# Patient Record
Sex: Female | Born: 1995
Health system: Southern US, Community
[De-identification: ages and names within clinical notes are randomized; demographics above are authoritative.]

## PROBLEM LIST (undated history)

## (undated) DIAGNOSIS — F3176 Bipolar disorder, in full remission, most recent episode depressed: Secondary | ICD-10-CM

## (undated) DIAGNOSIS — F419 Anxiety disorder, unspecified: Secondary | ICD-10-CM

## (undated) DIAGNOSIS — F329 Major depressive disorder, single episode, unspecified: Secondary | ICD-10-CM

## (undated) DIAGNOSIS — F32A Depression, unspecified: Secondary | ICD-10-CM

## (undated) HISTORY — DX: Depression, unspecified: F32.A

## (undated) HISTORY — PX: WISDOM TOOTH EXTRACTION: SHX21

## (undated) HISTORY — DX: Anxiety disorder, unspecified: F41.9

## (undated) HISTORY — PX: INDUCED ABORTION: SHX677

## (undated) HISTORY — DX: Major depressive disorder, single episode, unspecified: F32.9

---
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
## 2016-06-16 ENCOUNTER — Ambulatory Visit (INDEPENDENT_AMBULATORY_CARE_PROVIDER_SITE_OTHER): Payer: BLUE CROSS/BLUE SHIELD | Admitting: Family Medicine

## 2016-06-16 ENCOUNTER — Encounter: Payer: Self-pay | Admitting: Family Medicine

## 2016-06-16 VITALS — BP 118/64 | HR 100 | Temp 98.4°F | Resp 16 | Ht 67.0 in | Wt 191.0 lb

## 2016-06-16 DIAGNOSIS — N3090 Cystitis, unspecified without hematuria: Secondary | ICD-10-CM

## 2016-06-16 LAB — POCT URINALYSIS DIPSTICK
Bilirubin, UA: NEGATIVE
Blood, UA: NEGATIVE
Glucose, UA: NEGATIVE
Ketones, UA: NEGATIVE
Leukocytes, UA: NEGATIVE
Nitrite, UA: NEGATIVE
Protein, UA: NEGATIVE
Spec Grav, UA: 1.02 (ref 1.030–1.035)
Urobilinogen, UA: 0.2 (ref ?–2.0)
pH, UA: 6 (ref 5.0–8.0)

## 2016-06-16 MED ORDER — HYDROXYZINE HCL 10 MG PO TABS: 10.0000 mg | ORAL_TABLET | Freq: Three times a day (TID) | ORAL | 0 refills | Status: DC | PRN

## 2016-06-16 NOTE — Patient Instructions (Signed)
Try Uristat or similar for burning. We will call you with the referral time.

## 2016-06-16 NOTE — Progress Notes (Signed)
Subjective:     Patient ID: Olivia Thomas, female   DOB: May 19, 1995, 21 y.o.   MRN: [id number]  HPI  Chief Complaint  Patient presents with  . Urinary Tract Infection    Patient comes in today c/o burning on urination X 1 week. Patient reports that she has been drinking cranberry juice and taking tylenol for the pain. She denies any hematuria. She does have lower back pain with associated nausea.   Has history of initial urology evaluation for interstitial cystitis but did not complete the workup. Imipramine did not help her per her report. No vaginal discharge.    Review of Systems     Objective:   Physical Exam  Constitutional: She appears well-developed and well-nourished. No distress.  Genitourinary:  Genitourinary Comments: No cva tenderness       Assessment:    1. Cystitis; Probable I.C. - POCT urinalysis dipstick - Ambulatory referral to Urology - hydrOXYzine (ATARAX/VISTARIL) 10 MG tablet; Take 1 tablet (10 mg total) by mouth 3 (three) times daily as needed.  Dispense: 30 tablet; Refill: 0    Plan:    May also try otc urinary analgesic.

## 2016-08-22 ENCOUNTER — Ambulatory Visit
Admission: EM | Admit: 2016-08-22 | Discharge: 2016-08-22 | Disposition: A | Discharge status: Home / Self Care | Payer: Worker's Compensation | Attending: Family Medicine | Admitting: Family Medicine

## 2016-08-22 DIAGNOSIS — S61210A Laceration without foreign body of right index finger without damage to nail, initial encounter: Secondary | ICD-10-CM

## 2016-08-22 NOTE — ED Provider Notes (Signed)
CSN: [id number]     Arrival date & time 08/22/16  1027 History   First MD Initiated Contact with Patient 08/22/16 1110     Chief Complaint  Patient presents with  . Laceration    right index finger   (Consider location/radiation/quality/duration/timing/severity/associated sxs/prior Treatment) HPI   This a 21 year old female who sustained an laceration to her right dominant index finger overlying the DIP joint with a broken glass last night a [employer]. Happened around 8 or 9:00. There are want to come here last night but we were closed at the time that they have chosen. The patient tells me she would not had stitches anyhow because she doesn't want anything injected into her body. Is here mostly because it was required by her employer. This toxoid is current within the last 3 years according to the patient. He states she would've also refused a tetanus injection.      History reviewed. No pertinent past medical history. Past Surgical History:  Procedure Laterality Date  . NO PAST SURGERIES     Family History  Problem Relation Age of Onset  . Autoimmune disease Mother   . Osteoarthritis Father    Social History  Substance Use Topics  . Smoking status: Never Smoker  . Smokeless tobacco: Never Used  . Alcohol use Not on file   OB History    No data available     Review of Systems  Constitutional: Positive for activity change. Negative for appetite change, chills, fatigue and fever.  Skin: Positive for wound.  All other systems reviewed and are negative.   Allergies  Kiwi extract  Home Medications   Prior to Admission medications   Medication Sig Start Date End Date Taking? Authorizing Provider  hydrOXYzine (ATARAX/VISTARIL) 10 MG tablet Take 1 tablet (10 mg total) by mouth 3 (three) times daily as needed. 06/16/16   Anola Gurneyhauvin, Robert, PA  ibuprofen (ADVIL,MOTRIN) 600 MG tablet Take 600 mg by mouth. 12/19/14   [provider]  imipramine (TOFRANIL) 10 MG  tablet Take 10 mg by mouth. 03/24/15   [provider]  topiramate (TOPAMAX) 25 MG tablet  01/15/15   [provider]   Meds Ordered and Administered this Visit  Medications - No data to display  BP 121/78 (BP Location: Left Arm)   Pulse 90   Temp 98 F (36.7 C) (Oral)   Resp 18   Ht 5\' 7"  (1.702 m)   Wt 180 lb (81.6 kg)   SpO2 100%   BMI 28.19 kg/m  No data found.   Physical Exam  Constitutional: She is oriented to person, place, and time. She appears well-developed and well-nourished. No distress.  HENT:  Head: Normocephalic.  Eyes: Pupils are equal, round, and reactive to light.  Neck: Normal range of motion.  Musculoskeletal: Normal range of motion. She exhibits tenderness.  Neurological: She is alert and oriented to person, place, and time.  Skin: Skin is warm and dry. She is not diaphoretic.  The patient has a slightly obliquely oriented 2 centimeter laceration with the proximal most portion extending into subcutaneous tissue overlying the PIP joint on the volar surface. She is good range of motion of the finger. FDS and FDP are intact. Sensation is intact to light touch throughout.  Psychiatric: She has a normal mood and affect. Her behavior is normal. Judgment and thought content normal.  Nursing note and vitals reviewed.   Urgent Care Course     Procedures (including critical care time)  Labs  Review Labs Reviewed - No data to display  Imaging Review No results found.   Visual Acuity Review  Right Eye Distance:   Left Eye Distance:   Bilateral Distance:    Right Eye Near:   Left Eye Near:    Bilateral Near:     Procedure: Patient refused any suggestion of stitches. I told patient that I would prefer a suturing over the joint but she refused. Therefore the wound was cleansed with chlorhexidine solution. Dried thoroughly. Dermabond was utilized to glue the laceration. After allowing the glue to dry patient was placed in a stack splint for  protection. I have asked her not to peel off the glue as it starts to lift from the skin but to let it fall off of its own accord. Should use a stack splint for protection when at work. She may come out of it during quiet times. If she has any problems she should follow-up again with our clinic.    MDM   1. Laceration of right index finger without foreign body without damage to nail, initial encounter    Plan: 1. Test/x-ray results and diagnosis reviewed with patient 2. rx as per orders; risks, benefits, potential side effects reviewed with patient 3. Recommend supportive treatment with Use of stack splint while working. May be out during other times. She has no restrictions at work. She should follow-up in our clinic if she experiences any problems. 4. F/u prn if symptoms worsen or don't improve     Lutricia Feil, PA-C 08/22/16 1311

## 2016-08-22 NOTE — ED Notes (Signed)
[facility]

## 2016-08-22 NOTE — ED Triage Notes (Signed)
Pt cut her right index finger with a broken glass last night at [employer].

## 2016-09-25 ENCOUNTER — Telehealth: Payer: Self-pay | Admitting: Family Medicine

## 2016-09-25 NOTE — Telephone Encounter (Signed)
FYI--Tracy from Dr Cope's office called stating that pt was a no show for appointment in May

## 2016-09-25 NOTE — Telephone Encounter (Signed)
FYI

## 2016-10-12 ENCOUNTER — Ambulatory Visit (INDEPENDENT_AMBULATORY_CARE_PROVIDER_SITE_OTHER): Payer: BLUE CROSS/BLUE SHIELD | Admitting: Family Medicine

## 2016-10-12 ENCOUNTER — Encounter: Payer: Self-pay | Admitting: Family Medicine

## 2016-10-12 VITALS — BP 112/74 | HR 92 | Temp 97.9°F | Resp 16 | Wt 190.0 lb

## 2016-10-12 DIAGNOSIS — M545 Low back pain, unspecified: Secondary | ICD-10-CM

## 2016-10-12 LAB — POCT URINALYSIS DIPSTICK
Blood, UA: NEGATIVE
Glucose, UA: NEGATIVE
Ketones, UA: NEGATIVE
Leukocytes, UA: NEGATIVE
Nitrite, UA: NEGATIVE
Protein, UA: NEGATIVE
Spec Grav, UA: 1.015 (ref 1.010–1.025)
Urobilinogen, UA: 0.2 E.U./dL
pH, UA: 6 (ref 5.0–8.0)

## 2016-10-12 LAB — POCT URINE PREGNANCY: Preg Test, Ur: NEGATIVE

## 2016-10-12 NOTE — Patient Instructions (Signed)
Start ibuprofen 600 mg. 3 x day with food. Add warm compresses for 20 minutes several x day.

## 2016-10-12 NOTE — Progress Notes (Signed)
Subjective:     Patient ID: Inetta FermoJennifer A Mcgue, female   DOB: 05/31/1995, 21 y.o.   MRN: [id number]  HPI  Chief Complaint  Patient presents with  . Back Pain    Bilateral low back pain for 6 days. No radiation. No urinary sx. She is c/o nausea and vomiting from the pain. She is sexually active and does not use birth control due to being told she was infertile at 21 YO due to "issue with ovaries". The pain is aching. She rates it as 4/10 on a pain scale. No known injury.  States she woke ups with the pain without any specific injury. Reports she has irregular menses with last in March. No fever, chills, dysuria, abdominal pain or change in B & B habits. States she only vomited on 7/30 but has residual nausea. Works as a IT consultantparalegal.  Review of Systems     Objective:   Physical Exam  Constitutional: She appears well-developed and well-nourished. No distress.  Musculoskeletal:  Muscle strength in lower extremities 5/5. SLR's to 60 degrees without radiation of back pain. Tender over her lower paravertebral lumbar area.       Assessment:    1. Acute bilateral low back pain without sciatica - POCT urinalysis dipstick - POCT urine pregnancy    Plan:    Discussed use of nsaid's and heat.

## 2017-04-09 ENCOUNTER — Encounter: Payer: Self-pay | Admitting: Family Medicine

## 2017-04-09 ENCOUNTER — Ambulatory Visit: Payer: BLUE CROSS/BLUE SHIELD | Admitting: Family Medicine

## 2017-04-09 VITALS — BP 110/80 | HR 86 | Temp 98.3°F | Resp 16 | Wt 185.6 lb

## 2017-04-09 DIAGNOSIS — F321 Major depressive disorder, single episode, moderate: Secondary | ICD-10-CM | POA: Diagnosis not present

## 2017-04-09 NOTE — Patient Instructions (Signed)
We will call you about the referral. Also may go to [facility] crisis walk in center on [address] [place].

## 2017-04-09 NOTE — Progress Notes (Signed)
Subjective:     Patient ID: Olivia FermoJennifer A Thomas, female   DOB: 02/23/1996, 21 y.o.   MRN: [id number] HPI Chief Complaint  Patient presents with  . Advice Only    Patient comes in office today to discuss mental health. Patient reports for the past 4-5 years she has been dealing with hearing "voices" inside her head and racing thoughts. Patient states that she has noticed changes peers around her have noticed such as isolation, mood changes and change in appetite. Patient states that she has a history of concussions and it was once mentioned to her by neurologist borderline personality disorder. Patient has a history of depression, anxiety and self harming.  States she has heard voices in her head arguing with each other since childhood: "I am two persons"-one who is caring and one who is mean. Reports manic behavior periodically when she will do "crazy" things. Also has a hx of cutting behavior particularly as a teenager though not in the last year. States she tends to be mean to her fiance and best friend and is concerned about losing them. Currently working successfully as a wait-person. Reports a series of concussions over the last 5 years and feels her behavior has changed over that period.  Review of Systems     Objective:   Physical Exam  Constitutional: She appears well-developed and well-nourished. She appears distressed (tearful at tjmes).  Psychiatric:  Good eye contact, not actively contemplating suicide.       Assessment:    1. Depression, major, single episode, moderate (HCC): mania and psychotic features. - Ambulatory referral to Psychiatry    Plan:    Will call with referral. Discussed [facility] walk-in crisis center if needed.

## 2017-05-04 ENCOUNTER — Encounter: Payer: Self-pay | Admitting: Psychiatry

## 2017-05-04 ENCOUNTER — Ambulatory Visit: Payer: BLUE CROSS/BLUE SHIELD | Admitting: Psychiatry

## 2017-05-04 ENCOUNTER — Other Ambulatory Visit: Payer: Self-pay

## 2017-05-04 VITALS — BP 108/82 | Temp 98.2°F | Wt 180.8 lb

## 2017-05-04 DIAGNOSIS — F312 Bipolar disorder, current episode manic severe with psychotic features: Secondary | ICD-10-CM

## 2017-05-04 DIAGNOSIS — F410 Panic disorder [episodic paroxysmal anxiety] without agoraphobia: Secondary | ICD-10-CM

## 2017-05-04 DIAGNOSIS — F509 Eating disorder, unspecified: Secondary | ICD-10-CM | POA: Diagnosis not present

## 2017-05-04 MED ORDER — LURASIDONE HCL 40 MG PO TABS
40.0000 mg | ORAL_TABLET | Freq: Every day | ORAL | 1 refills | Status: DC
Start: 2017-05-04 — End: 2017-05-07

## 2017-05-04 MED ORDER — TRAZODONE HCL 50 MG PO TABS: 25.0000 mg | ORAL_TABLET | Freq: Every evening | ORAL | 1 refills | Status: DC | PRN

## 2017-05-04 NOTE — Patient Instructions (Signed)
Lurasidone oral tablet What is this medicine? LURASIDONE (loo RAS i done) is an antipsychotic. It is used to treat schizophrenia or bipolar disorder, also known as manic-depression. This medicine may be used for other purposes; ask your health care provider or pharmacist if you have questions. COMMON BRAND NAME(S): Latuda What should I tell my health care provider before I take this medicine? They need to know if you have any of these conditions: -dementia -diabetes -difficulty swallowing -heart disease -history of breast cancer -kidney disease -liver disease -low blood counts, like low white cell, platelet, or red cell counts -low blood pressure -Parkinson's disease -seizures -suicidal thoughts, plans, or attempt; a previous suicide attempt by you or a family member -an unusual or allergic reaction to lurasidone, other medicines, foods, dyes, or preservatives -pregnant or trying to get pregnant -breast-feeding How should I use this medicine? Take this medicine by mouth with a glass of water. Follow the directions on the prescription label. Take this medicine with food. Take your medicine at regular intervals. Do not take it more often than directed. Do not stop taking except on your doctor's advice. Talk to your pediatrician regarding the use of this medicine in children. While this drug may be prescribed for children as young as 13 years for selected conditions, precautions do apply. Overdosage: If you think you have taken too much of this medicine contact a poison control center or emergency room at once. NOTE: This medicine is only for you. Do not share this medicine with others. What if I miss a dose? If you miss a dose, take it as soon as you can. If it is almost time for your next dose, take only that dose. Do not take double or extra doses. What may interact with this medicine? Do not take this medicine with any of the following medications: -dalfopristin;  quinupristin -delavirdine -indinavir -isoniazid -itraconazole -ketoconazole -lumacaftor; ivacaftor -metoclopramide -rifampin -ritonavir -tipranavir This medicine may also interact with the following medications: -alcohol -certain medicines for anxiety or sleep -diltiazem -digoxin -lithium -medicines for blood pressure This list may not describe all possible interactions. Give your health care provider a list of all the medicines, herbs, non-prescription drugs, or dietary supplements you use. Also tell them if you smoke, drink alcohol, or use illegal drugs. Some items may interact with your medicine. What should I watch for while using this medicine? Visit your doctor or health care professional for regular checks on your progress. It may be several weeks before you see the full effects of this medicine. Notify your doctor or health care professional if your symptoms get worse, if you have new symptoms, if you are having an unusual effect from this medicine, or if you feel out of control, very discouraged or think you might harm yourself or others. Do not suddenly stop taking this medicine. You may need to gradually reduce the dose. Ask your doctor or health care professional for advice. You may get dizzy or drowsy. Do not drive, use machinery, or do anything that needs mental alertness until you know how this medicine affects you. Do not stand or sit up quickly, especially if you are an older patient. This reduces the risk of dizzy or fainting spells. Alcohol can increase dizziness and drowsiness. Avoid alcoholic drinks. This medicine may cause dry eyes and blurred vision. If you wear contact lenses you may feel some discomfort. Lubricating drops may help. See your eye doctor if the problem does not go away or is severe. This medicine can   reduce the response of your body to heat or cold. Dress warm in cold weather and stay hydrated in hot weather. If possible, avoid extreme temperatures like  saunas, hot tubs, very hot or cold showers, or activities that can cause dehydration such as vigorous exercise. What side effects may I notice from receiving this medicine? Side effects that you should report to your doctor or health care professional as soon as possible: -allergic reactions like skin rash, itching or hives, swelling of the face, lips, or tongue -confusion -feeling faint or lightheaded, falls -fever or chills, sore throat -high fever -inability to control muscle movements in the face, mouth, hands, arms, or legs -increased hunger or thirst -increased urination -missed menstrual periods -restlessness or need to keep moving -seizures -stiffness, spasms, trembling -suicidal thoughts or other mood changes -unusually weak or tired Side effects that usually do not require medical attention (report to your doctor or health care professional if they continue or are bothersome): -blurred vision -change in sex drive or performance -diarrhea -drowsiness -nausea, vomiting -weight gain This list may not describe all possible side effects. Call your doctor for medical advice about side effects. You may report side effects to FDA at 1-800-FDA-1088. Where should I keep my medicine? Keep out of the reach of children. Store at room temperature between 15 and 30 degrees C (59 and 86 degrees F). Throw away any unused medicine after the expiration date. NOTE: This sheet is a summary. It may not cover all possible information. If you have questions about this medicine, talk to your doctor, pharmacist, or health care provider.  2018 Elsevier/Gold Standard (2015-04-14 10:23:22)  
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
## 2017-05-07 ENCOUNTER — Telehealth: Payer: Self-pay | Admitting: Psychiatry

## 2017-05-07 DIAGNOSIS — F319 Bipolar disorder, unspecified: Secondary | ICD-10-CM

## 2017-05-07 MED ORDER — ARIPIPRAZOLE 5 MG PO TABS: 5.0000 mg | ORAL_TABLET | Freq: Every day | ORAL | 0 refills | Status: DC

## 2017-05-07 NOTE — Telephone Encounter (Signed)
Pt unable to afford Latuda.  We will start her on Abilify 5 mg p.o. nightly.  Called patient to discuss this.  She agrees with plan.  Abilify 5 mg for 30 days supply sent to her pharmacy at [facility]

## 2017-05-23 ENCOUNTER — Ambulatory Visit: Payer: BLUE CROSS/BLUE SHIELD | Admitting: Licensed Clinical Social Worker

## 2017-06-01 ENCOUNTER — Encounter: Payer: Self-pay | Admitting: Psychiatry

## 2017-06-01 ENCOUNTER — Ambulatory Visit: Payer: BLUE CROSS/BLUE SHIELD | Admitting: Psychiatry

## 2017-06-01 ENCOUNTER — Other Ambulatory Visit: Payer: Self-pay

## 2017-06-01 VITALS — BP 129/86 | HR 102 | Temp 98.5°F | Wt 186.4 lb

## 2017-06-01 DIAGNOSIS — F509 Eating disorder, unspecified: Secondary | ICD-10-CM | POA: Diagnosis not present

## 2017-06-01 DIAGNOSIS — F250 Schizoaffective disorder, bipolar type: Secondary | ICD-10-CM

## 2017-06-01 DIAGNOSIS — F410 Panic disorder [episodic paroxysmal anxiety] without agoraphobia: Secondary | ICD-10-CM

## 2017-06-01 MED ORDER — DIVALPROEX SODIUM ER 250 MG PO TB24: 750.0000 mg | ORAL_TABLET | Freq: Every day | ORAL | 1 refills | Status: DC

## 2017-06-01 MED ORDER — RISPERIDONE 0.5 MG PO TABS: 0.5000 mg | ORAL_TABLET | Freq: Every day | ORAL | 1 refills | Status: DC

## 2017-06-01 NOTE — Patient Instructions (Signed)
Valproic Acid, Divalproex Sodium delayed or extended-release tablets What is this medicine? DIVALPROEX SODIUM (dye VAL pro ex SO dee um) is used to prevent seizures caused by some forms of epilepsy. It is also used to treat bipolar mania and to prevent migraine headaches. This medicine may be used for other purposes; ask your health care provider or pharmacist if you have questions. COMMON BRAND NAME(S): Depakote, Depakote ER What should I tell my health care provider before I take this medicine? They need to know if you have any of these conditions: -if you often drink alcohol -kidney disease -liver disease -low platelet counts -mitochondrial disease -suicidal thoughts, plans, or attempt; a previous suicide attempt by you or a family member -urea cycle disorder (UCD) -an unusual or allergic reaction to divalproex sodium, sodium valproate, valproic acid, other medicines, foods, dyes, or preservatives -pregnant or trying to get pregnant -breast-feeding How should I use this medicine? Take this medicine by mouth with a drink of water. Follow the directions on the prescription label. Do not cut, crush or chew this medicine. You can take it with or without food. If it upsets your stomach, take it with food. Take your medicine at regular intervals. Do not take it more often than directed. Do not stop taking except on your doctor's advice. A special MedGuide will be given to you by the pharmacist with each prescription and refill. Be sure to read this information carefully each time. Talk to your pediatrician regarding the use of this medicine in children. While this drug may be prescribed for children as young as 10 years for selected conditions, precautions do apply. Overdosage: If you think you have taken too much of this medicine contact a poison control center or emergency room at once. NOTE: This medicine is only for you. Do not share this medicine with others. What if I miss a dose? If you  miss a dose, take it as soon as you can. If it is almost time for your next dose, take only that dose. Do not take double or extra doses. What may interact with this medicine? Do not take this medicine with any of the following medications: -sodium phenylbutyrate This medicine may also interact with the following medications: -aspirin -certain antibiotics like ertapenem, imipenem, meropenem -certain medicines for depression, anxiety, or psychotic disturbances -certain medicines for seizures like carbamazepine, clonazepam, diazepam, ethosuximide, felbamate, lamotrigine, phenobarbital, phenytoin, primidone, rufinamide, topiramate -certain medicines that treat or prevent blood clots like warfarin -cholestyramine -female hormones, like estrogens and birth control pills, patches, or rings -propofol -rifampin -ritonavir -tolbutamide -zidovudine This list may not describe all possible interactions. Give your health care provider a list of all the medicines, herbs, non-prescription drugs, or dietary supplements you use. Also tell them if you smoke, drink alcohol, or use illegal drugs. Some items may interact with your medicine. What should I watch for while using this medicine? Tell your doctor or healthcare professional if your symptoms do not get better or they start to get worse. Wear a medical ID bracelet or chain, and carry a card that describes your disease and details of your medicine and dosage times. You may get drowsy, dizzy, or have blurred vision. Do not drive, use machinery, or do anything that needs mental alertness until you know how this medicine affects you. To reduce dizzy or fainting spells, do not sit or stand up quickly, especially if you are an older patient. Alcohol can increase drowsiness and dizziness. Avoid alcoholic drinks. This medicine can make you   more sensitive to the sun. Keep out of the sun. If you cannot avoid being in the sun, wear protective clothing and use  sunscreen. Do not use sun lamps or tanning beds/booths. Patients and their families should watch out for new or worsening depression or thoughts of suicide. Also watch out for sudden changes in feelings such as feeling anxious, agitated, panicky, irritable, hostile, aggressive, impulsive, severely restless, overly excited and hyperactive, or not being able to sleep. If this happens, especially at the beginning of treatment or after a change in dose, call your health care professional. Women should inform their doctor if they wish to become pregnant or think they might be pregnant. There is a potential for serious side effects to an unborn child. Talk to your health care professional or pharmacist for more information. Women who become pregnant while using this medicine may enroll in the [place] Antiepileptic Drug Pregnancy Registry by calling [phone]. This registry collects information about the safety of antiepileptic drug use during pregnancy. What side effects may I notice from receiving this medicine? Side effects that you should report to your doctor or health care professional as soon as possible: -allergic reactions like skin rash, itching or hives, swelling of the face, lips, or tongue -changes in vision -redness, blistering, peeling or loosening of the skin, including inside the mouth -signs and symptoms of liver injury like dark yellow or brown urine; general ill feeling or flu-like symptoms; light-colored stools; loss of appetite; nausea; right upper belly pain; unusually weak or tired; yellowing of the eyes or skin -suicidal thoughts or other mood changes -unusual bleeding or bruising Side effects that usually do not require medical attention (report to your doctor or health care professional if they continue or are bothersome): -constipation -diarrhea -dizziness -hair loss -headache -loss of appetite -weight gain This list may not describe all possible side effects. Call  your doctor for medical advice about side effects. You may report side effects to FDA at 1-800-FDA-1088. Where should I keep my medicine? Keep out of reach of children. Store at room temperature between 15 and 30 degrees C (59 and 86 degrees F). Keep container tightly closed. Throw away any unused medicine after the expiration date. NOTE: This sheet is a summary. It may not cover all possible information. If you have questions about this medicine, talk to your doctor, pharmacist, or health care provider.  2018 Elsevier/Gold Standard (2015-06-03 07:11:40) Risperidone tablets What is this medicine? RISPERIDONE (ris PER i done) is an antipsychotic. It is used to treat schizophrenia, bipolar disorder, and some symptoms of autism. This medicine may be used for other purposes; ask your health care provider or pharmacist if you have questions. COMMON BRAND NAME(S): Risperdal What should I tell my health care provider before I take this medicine? They need to know if you have any of these conditions: -blood disorder or disease -dementia -diabetes or a family history of diabetes -difficulty swallowing -heart disease or previous heart attack -history of brain tumor or head injury -history of breast cancer -irregular heartbeat or low blood pressure -kidney or liver disease -Parkinson's disease -seizures (convulsions) -an unusual or allergic reaction to risperidone, paliperidone, other medicines, foods, dyes, or preservatives -pregnant or trying to get pregnant -breast-feeding How should I use this medicine? Take this medicine by mouth with a glass of water. Follow the directions on the prescription label. You can take it with or without food. If it upsets your stomach, take it with food. Take your medicine at regular  intervals. Do not take it more often than directed. Do not stop taking except on your doctor's advice. Talk to your pediatrician regarding the use of this medicine in children. While  this drug may be prescribed for children as young as 46 years of age for selected conditions, precautions do apply. Overdosage: If you think you have taken too much of this medicine contact a poison control center or emergency room at once. NOTE: This medicine is only for you. Do not share this medicine with others. What if I miss a dose? If you miss a dose, take it as soon as you can. If it is almost time for your next dose, take only that dose. Do not take double or extra doses. What may interact with this medicine? Do not take this medicine with any of the following medications: -certain medicines for fungal infections like fluconazole, itraconazole, ketoconazole, posaconazole, voriconazole -cisapride -dofetilide -dronedarone -droperidol -pimozide -sparfloxacin -thioridazine This medicine may also interact with the following medications: -arsenic trioxide -certain antibiotics like clarithromycin, gatifloxacin, levofloxacin, moxifloxacin, pentamidine, rifampin -certain medicines for blood pressure -certain medicines for cancer -certain medicines for irregular heart beat -certain medications for Parkinson's disease like levodopa -certain medicines for seizures like carbamazepine -certain medicines for sleep or sedation -narcotic medicines for pain -other medicines for mental anxiety, depression, or psychotic disturbances -other medicines that prolong the QT interval (cause an abnormal heart rhythm) -ritonavir This list may not describe all possible interactions. Give your health care provider a list of all the medicines, herbs, non-prescription drugs, or dietary supplements you use. Also tell them if you smoke, drink alcohol, or use illegal drugs. Some items may interact with your medicine. What should I watch for while using this medicine? Visit your doctor or health care professional for regular checks on your progress. It may be several weeks before you see the full effects. Do not  suddenly stop taking this medicine. You may need to gradually reduce the dose. Only stop taking this medicine on the advice of your doctor or health care professional. Bonita Quin may get dizzy or drowsy. Do not drive, use machinery, or do anything that needs mental alertness until you know how this medicine affects you. Do not stand or sit up quickly, especially if you are an older patient. This reduces the risk of dizzy or fainting spells. Alcohol can increase dizziness and drowsiness. Avoid alcoholic drinks. You can get a hangover effect the morning after a bedtime dose. Do not treat yourself for colds, diarrhea or allergies. Ask your doctor or health care professional for advice, some nonprescription medicines may increase possible side effects. This medicine can reduce the response of your body to heat or cold. Dress warm in cold weather and stay hydrated in hot weather. If possible, avoid extreme temperatures like saunas, hot tubs, very hot or cold showers, or activities that can cause dehydration such as vigorous exercise. What side effects may I notice from receiving this medicine? Side effects that you should report to your doctor or health care professional as soon as possible: -aching muscles and joints -confusion -fainting spells -fast or irregular heartbeat -fever or chills, sore throat -increased hunger or thirst -increased urination -loss of balance, difficulty walking or falls -stiffness, spasms, trembling -uncontrollable head, mouth, neck, arm, or leg movements -unusually weak or tired Side effects that usually do not require medical attention (report to your doctor or health care professional if they continue or are bothersome): -constipation -decreased sexual ability -difficulty sleeping -drowsiness or dizziness -increase or  decrease in saliva -nausea, vomiting -weight gain This list may not describe all possible side effects. Call your doctor for medical advice about side  effects. You may report side effects to FDA at 1-800-FDA-1088. Where should I keep my medicine? Keep out of the reach of children. Store at room temperature between 15 and 25 degrees C (59 and 77 degrees F). Protect from light. Throw away any unused medicine after the expiration date. NOTE: This sheet is a summary. It may not cover all possible information. If you have questions about this medicine, talk to your doctor, pharmacist, or health care provider.  2018 Elsevier/Gold Standard (2015-10-18 18:50:25)
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
## 2017-06-14 ENCOUNTER — Other Ambulatory Visit: Payer: Self-pay | Admitting: Psychiatry

## 2017-06-14 ENCOUNTER — Ambulatory Visit: Payer: BLUE CROSS/BLUE SHIELD | Admitting: Psychiatry

## 2017-06-14 ENCOUNTER — Other Ambulatory Visit: Payer: Self-pay

## 2017-06-14 ENCOUNTER — Encounter: Payer: Self-pay | Admitting: Psychiatry

## 2017-06-14 VITALS — BP 105/72 | HR 86 | Temp 98.6°F | Wt 184.0 lb

## 2017-06-14 DIAGNOSIS — F250 Schizoaffective disorder, bipolar type: Secondary | ICD-10-CM | POA: Diagnosis not present

## 2017-06-14 DIAGNOSIS — F3112 Bipolar disorder, current episode manic without psychotic features, moderate: Secondary | ICD-10-CM | POA: Diagnosis not present

## 2017-06-14 DIAGNOSIS — F410 Panic disorder [episodic paroxysmal anxiety] without agoraphobia: Secondary | ICD-10-CM

## 2017-06-14 DIAGNOSIS — F509 Eating disorder, unspecified: Secondary | ICD-10-CM
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
## 2017-06-15 LAB — COMPREHENSIVE METABOLIC PANEL
ALT: 13 IU/L (ref 0–32)
AST: 12 IU/L (ref 0–40)
Albumin/Globulin Ratio: 1.7 (ref 1.2–2.2)
Albumin: 4.5 g/dL (ref 3.5–5.5)
Alkaline Phosphatase: 73 IU/L (ref 39–117)
BUN/Creatinine Ratio: 14 (ref 9–23)
BUN: 10 mg/dL (ref 6–20)
Bilirubin Total: <0.2 mg/dL (ref 0.0–1.2)
CO2: 24 mmol/L (ref 20–29)
Calcium: 9.9 mg/dL (ref 8.7–10.2)
Chloride: 101 mmol/L (ref 96–106)
Creatinine, Ser: 0.7 mg/dL (ref 0.57–1.00)
GFR calc Af Amer: 143 mL/min/{1.73_m2} (ref >59)
GFR calc non Af Amer: 124 mL/min/{1.73_m2} (ref >59)
Globulin, Total: 2.6 g/dL (ref 1.5–4.5)
Glucose: 71 mg/dL (ref 65–99)
Potassium: 4 mmol/L (ref 3.5–5.2)
Sodium: 141 mmol/L (ref 134–144)
Total Protein: 7.1 g/dL (ref 6.0–8.5)

## 2017-06-15 LAB — CBC WITH DIFFERENTIAL/PLATELET
Basophils Absolute: 0 10*3/uL (ref 0.0–0.2)
Basos: 0 %
EOS (ABSOLUTE): 0.1 10*3/uL (ref 0.0–0.4)
Eos: 1 %
Hematocrit: 40.9 % (ref 34.0–46.6)
Hemoglobin: 14.1 g/dL (ref 11.1–15.9)
Immature Grans (Abs): 0 10*3/uL (ref 0.0–0.1)
Immature Granulocytes: 0 %
Lymphocytes Absolute: 2.2 10*3/uL (ref 0.7–3.1)
Lymphs: 27 %
MCH: 28.7 pg (ref 26.6–33.0)
MCHC: 34.5 g/dL (ref 31.5–35.7)
MCV: 83 fL (ref 79–97)
Monocytes Absolute: 0.5 10*3/uL (ref 0.1–0.9)
Monocytes: 6 %
Neutrophils Absolute: 5.3 10*3/uL (ref 1.4–7.0)
Neutrophils: 66 %
Platelets: 324 10*3/uL (ref 150–379)
RBC: 4.91 x10E6/uL (ref 3.77–5.28)
RDW: 13 % (ref 12.3–15.4)
WBC: 8.1 10*3/uL (ref 3.4–10.8)

## 2017-06-15 LAB — HGB A1C W/O EAG: Hgb A1c MFr Bld: 5 % (ref 4.8–5.6)

## 2017-06-15 LAB — LIPID PANEL W/O CHOL/HDL RATIO
Cholesterol, Total: 217 mg/dL — ABNORMAL HIGH (ref 100–199)
HDL: 44 mg/dL (ref >39)
LDL Calculated: 109 mg/dL — ABNORMAL HIGH (ref 0–99)
Triglycerides: 322 mg/dL — ABNORMAL HIGH (ref 0–149)
VLDL Cholesterol Cal: 64 mg/dL — ABNORMAL HIGH (ref 5–40)

## 2017-06-15 LAB — B12 AND FOLATE PANEL
Folate: 13.2 ng/mL (ref >3.0)
Vitamin B-12: 525 pg/mL (ref 232–1245)

## 2017-06-15 LAB — PROLACTIN: Prolactin: 30.6 ng/mL — ABNORMAL HIGH (ref 4.8–23.3)

## 2017-06-15 LAB — TSH: TSH: 2.02 u[IU]/mL (ref 0.450–4.500)

## 2017-06-15 LAB — VALPROIC ACID LEVEL: Valproic Acid Lvl: 63 ug/mL (ref 50–100)

## 2017-06-21 ENCOUNTER — Ambulatory Visit: Payer: BLUE CROSS/BLUE SHIELD | Admitting: Psychiatry

## 2017-06-21 ENCOUNTER — Ambulatory Visit: Payer: BLUE CROSS/BLUE SHIELD | Admitting: Licensed Clinical Social Worker

## 2017-07-02 ENCOUNTER — Telehealth (HOSPITAL_COMMUNITY): Payer: Self-pay

## 2017-07-02 DIAGNOSIS — F312 Bipolar disorder, current episode manic severe with psychotic features: Secondary | ICD-10-CM

## 2017-07-02 MED ORDER — TRAZODONE HCL 50 MG PO TABS: 25.0000 mg | ORAL_TABLET | Freq: Every evening | ORAL | 0 refills | Status: DC | PRN

## 2017-07-02 NOTE — Telephone Encounter (Signed)
Medication refill request - [place] [facility] [place] a refill of patient's Trazodone, last ordered 05/04/17 + 1 refill and patient was last seen 06/14/17.

## 2017-07-02 NOTE — Telephone Encounter (Signed)
Patient needs to be seen for further refills. Pt is no show , no call for IOP as well as missed her OP visit.

## 2017-07-02 NOTE — Telephone Encounter (Signed)
Pt given trazodone for a month to last until then

## 2017-07-26 ENCOUNTER — Other Ambulatory Visit: Payer: Self-pay | Admitting: Psychiatry

## 2017-07-26 DIAGNOSIS — F250 Schizoaffective disorder, bipolar type: Secondary | ICD-10-CM

## 2017-07-31 ENCOUNTER — Ambulatory Visit: Payer: BLUE CROSS/BLUE SHIELD | Admitting: Family Medicine

## 2017-07-31 ENCOUNTER — Encounter: Payer: Self-pay | Admitting: Family Medicine

## 2017-07-31 VITALS — BP 100/68 | HR 103 | Temp 99.1°F | Resp 16 | Ht 67.0 in | Wt 185.0 lb

## 2017-07-31 DIAGNOSIS — J029 Acute pharyngitis, unspecified: Secondary | ICD-10-CM

## 2017-07-31 LAB — POCT RAPID STREP A (OFFICE): Rapid Strep A Screen: NEGATIVE

## 2017-07-31 NOTE — Progress Notes (Signed)
  Subjective:     Patient ID: Olivia Thomas, female   DOB: Mar 20, 1995, 22 y.o.   MRN: [id number] Chief Complaint  Patient presents with  . Sore Throat  . Fever   HPI Also felt weak and nauseous but no cough or significant sinus congestion. Accompanied by her fiancee today.  Review of Systems     Objective:   Physical Exam  Constitutional: She appears well-developed and well-nourished. No distress.  Ears: T.M's intact without inflammation Throat:mild tonsillar enlargement/erythema without exudate Neck: Bilateral tender anterior cervical nodes Lungs: clear     Assessment:    1. Sore throat: suspect early URI - POCT rapid strep A    Plan:    Discussed use of Mucinex D for congestion, Delsym for cough, and Benadryl for postnasal drainage. Work excuse for today.

## 2017-07-31 NOTE — Patient Instructions (Signed)
Discussed use of Mucinex D for congestion, Delsym for cough, and Benadryl for postnasal drainage 

## 2017-09-11 ENCOUNTER — Encounter: Payer: Self-pay | Admitting: Family Medicine

## 2017-09-11 ENCOUNTER — Ambulatory Visit: Payer: BLUE CROSS/BLUE SHIELD | Admitting: Family Medicine

## 2017-09-11 VITALS — BP 116/80 | HR 76 | Temp 97.7°F | Resp 15 | Wt 184.8 lb

## 2017-09-11 DIAGNOSIS — N3090 Cystitis, unspecified without hematuria: Secondary | ICD-10-CM | POA: Diagnosis not present

## 2017-09-11 LAB — POCT URINALYSIS DIPSTICK
Blood, UA: NEGATIVE
Glucose, UA: NEGATIVE
Ketones, UA: NEGATIVE
Nitrite, UA: POSITIVE
Spec Grav, UA: 1.02 (ref 1.010–1.025)
Urobilinogen, UA: 1 E.U./dL
pH, UA: 6 (ref 5.0–8.0)

## 2017-09-11 MED ORDER — CEPHALEXIN 500 MG PO CAPS: 500.0000 mg | ORAL_CAPSULE | Freq: Two times a day (BID) | ORAL | 0 refills | Status: DC

## 2017-09-11 NOTE — Progress Notes (Signed)
  Subjective:     Patient ID: Olivia Thomas, female   DOB: 11/26/1995, 22 y.o.   MRN: [id number] Chief Complaint  Patient presents with  . Dysuria    Patient comes in office today with complaints of dysuria, lower abdominal pressure anc back pain for 5 days.   HPI States she has not had a period since 2017 due to ? Cystic ovaries.  Review of Systems     Objective:   Physical Exam  Genitourinary:  Genitourinary Comments: No cva tenderness       Assessment:    1. Cystitis - Urine Culture - POCT urinalysis dipstick - cephALEXin (KEFLEX) 500 MG capsule; Take 1 capsule (500 mg total) by mouth 2 (two) times daily.  Dispense: 14 capsule; Refill: 0    Plan:    Further f/u pending urine culture results.

## 2017-09-11 NOTE — Patient Instructions (Signed)
We will call with the urine culture results. 

## 2017-09-14 ENCOUNTER — Telehealth: Payer: Self-pay

## 2017-09-14 LAB — URINE CULTURE

## 2017-09-14 NOTE — Telephone Encounter (Signed)
LMTCB on home/cell. KW

## 2017-09-14 NOTE — Telephone Encounter (Signed)
-----   Message from Anola Gurneyobert Chauvin, [place] sent at 09/14/2017  4:50 PM EDT ----- Staphyloccous species on urine culture. Are you improving on cephalexin.

## 2017-09-17 NOTE — Telephone Encounter (Signed)
[id number] 

## 2017-09-21 NOTE — Telephone Encounter (Signed)
Patient advised.KW 

## 2017-11-19 ENCOUNTER — Other Ambulatory Visit: Payer: Self-pay

## 2017-11-19 ENCOUNTER — Encounter: Payer: Self-pay | Admitting: Emergency Medicine

## 2017-11-19 ENCOUNTER — Ambulatory Visit
Admission: EM | Admit: 2017-11-19 | Discharge: 2017-11-19 | Disposition: A | Discharge status: Home / Self Care | Payer: Worker's Compensation | Attending: Emergency Medicine | Admitting: Emergency Medicine

## 2017-11-19 DIAGNOSIS — S0990XA Unspecified injury of head, initial encounter: Secondary | ICD-10-CM

## 2017-11-19 DIAGNOSIS — S0001XA Abrasion of scalp, initial encounter: Secondary | ICD-10-CM | POA: Diagnosis not present

## 2017-11-19 DIAGNOSIS — W2209XA Striking against other stationary object, initial encounter: Secondary | ICD-10-CM | POA: Diagnosis not present

## 2017-11-19 HISTORY — DX: Bipolar disorder, in full remission, most recent episode depressed: F31.76

## 2017-11-19 NOTE — Discharge Instructions (Addendum)
Keep top of head dry for next 24 hours. Then wash area gently with soap and water. May take Tylenol 1000mg  every 8 hours as needed for pain. If any vomiting, increased dizziness, increased headache/pain or syncope occurs, go to the ER ASAP. Otherwise, follow-up with your PCP in 2 to 3 days if not improving.

## 2017-11-19 NOTE — ED Triage Notes (Signed)
Patient stated she was at work and hit her head on a metal piece of equipment. Bleeding noted to the top of her head. Patient now c/o headache.
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
## 2017-11-21 ENCOUNTER — Emergency Department: Payer: Worker's Compensation

## 2017-11-21 ENCOUNTER — Encounter: Payer: Self-pay | Admitting: Emergency Medicine

## 2017-11-21 ENCOUNTER — Emergency Department
Admission: EM | Admit: 2017-11-21 | Discharge: 2017-11-21 | Disposition: A | Discharge status: Home / Self Care | Payer: Worker's Compensation | Attending: Student in an Organized Health Care Education/Training Program | Admitting: Student in an Organized Health Care Education/Training Program

## 2017-11-21 DIAGNOSIS — W208XXA Other cause of strike by thrown, projected or falling object, initial encounter: Secondary | ICD-10-CM | POA: Insufficient documentation

## 2017-11-21 DIAGNOSIS — Y92512 Supermarket, store or market as the place of occurrence of the external cause: Secondary | ICD-10-CM | POA: Insufficient documentation

## 2017-11-21 DIAGNOSIS — Y939 Activity, unspecified: Secondary | ICD-10-CM | POA: Diagnosis not present

## 2017-11-21 DIAGNOSIS — Y990 Civilian activity done for income or pay: Secondary | ICD-10-CM | POA: Insufficient documentation

## 2017-11-21 DIAGNOSIS — R42 Dizziness and giddiness: Secondary | ICD-10-CM | POA: Diagnosis not present

## 2017-11-21 DIAGNOSIS — S060X0A Concussion without loss of consciousness, initial encounter: Secondary | ICD-10-CM | POA: Diagnosis not present

## 2017-11-21 DIAGNOSIS — Z79899 Other long term (current) drug therapy: Secondary | ICD-10-CM | POA: Insufficient documentation

## 2017-11-21 DIAGNOSIS — Y9389 Activity, other specified: Secondary | ICD-10-CM | POA: Insufficient documentation

## 2017-11-21 DIAGNOSIS — S0990XA Unspecified injury of head, initial encounter: Secondary | ICD-10-CM | POA: Diagnosis present

## 2017-11-21 DIAGNOSIS — R2681 Unsteadiness on feet: Secondary | ICD-10-CM | POA: Diagnosis not present

## 2017-11-21 MED ORDER — ASPIRIN-ACETAMINOPHEN-CAFFEINE 250-250-65 MG PO TABS: 1.0000 | ORAL_TABLET | Freq: Four times a day (QID) | ORAL | 1 refills | Status: DC | PRN
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
## 2017-11-21 NOTE — ED Notes (Addendum)
Pt supervisor Shann Medal [phone] stated that he knew that pt needed a drug test but not anything after that. Pt manager Judie Grieve called [phone] and left voicemail to call hospital back for further information.

## 2017-11-21 NOTE — ED Notes (Addendum)
Pt supervisor states that she does not need any further testing and has already had some workers comp paperwork filled.
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
## 2017-12-18 ENCOUNTER — Encounter: Payer: Self-pay | Admitting: Family Medicine

## 2017-12-18 ENCOUNTER — Ambulatory Visit: Payer: BLUE CROSS/BLUE SHIELD | Admitting: Family Medicine

## 2017-12-18 VITALS — BP 122/84 | HR 139 | Temp 98.2°F | Resp 16 | Wt 189.2 lb

## 2017-12-18 DIAGNOSIS — Z3201 Encounter for pregnancy test, result positive: Secondary | ICD-10-CM

## 2017-12-18 DIAGNOSIS — R1030 Lower abdominal pain, unspecified: Secondary | ICD-10-CM

## 2017-12-18 LAB — POCT URINALYSIS DIPSTICK
Blood, UA: NEGATIVE
Glucose, UA: NEGATIVE
Ketones, UA: NEGATIVE
Nitrite, UA: NEGATIVE
Protein, UA: NEGATIVE
Spec Grav, UA: 1.01 (ref 1.010–1.025)
Urobilinogen, UA: 0.2 E.U./dL
pH, UA: 5 (ref 5.0–8.0)

## 2017-12-18 LAB — POCT URINE PREGNANCY: Preg Test, Ur: POSITIVE — AB

## 2017-12-18 NOTE — Progress Notes (Signed)
  Subjective:     Patient ID: Olivia Thomas, female   DOB: [birth year], 22 y.o.   MRN: [id number] Chief Complaint  Patient presents with  . Abdominal Pain    Patient comes in office today with complaints of lower abdominal cramping and lower back pain for the past 2 weeks. Patient states that she has also had symptoms of fatigue and tenderness of breast.   HPI Denies dysuria or constipation. Remains on Depakote. States that except for occasional spotting she has not had  menses in over a year.  Review of Systems     Objective:   Physical Exam  Constitutional: She appears well-developed and well-nourished. No distress.       Assessment:    1. Lower abdominal pain - POCT urinalysis dipstick - POCT urine pregnancy - Urine Culture  2. Positive urine pregnancy test - hCG, serum, qualitative    Plan:    Further f/u pending lab work. Have asked her to stop her Depakote as is Category D in pregnancy and contact her psychiatrist.

## 2017-12-18 NOTE — Patient Instructions (Signed)
Stop Depakote and contact your psychiatrist. We will call with the lab and urine results.

## 2017-12-19 ENCOUNTER — Other Ambulatory Visit: Payer: Self-pay | Admitting: Family Medicine

## 2017-12-19 ENCOUNTER — Encounter: Payer: Self-pay | Admitting: Emergency Medicine

## 2017-12-19 ENCOUNTER — Emergency Department: Payer: BLUE CROSS/BLUE SHIELD

## 2017-12-19 ENCOUNTER — Emergency Department
Admission: EM | Admit: 2017-12-19 | Discharge: 2017-12-19 | Disposition: A | Discharge status: Home / Self Care | Payer: BLUE CROSS/BLUE SHIELD | Attending: Emergency Medicine | Admitting: Emergency Medicine

## 2017-12-19 ENCOUNTER — Other Ambulatory Visit: Payer: Self-pay

## 2017-12-19 DIAGNOSIS — R102 Pelvic and perineal pain: Secondary | ICD-10-CM

## 2017-12-19 DIAGNOSIS — M25472 Effusion, left ankle: Secondary | ICD-10-CM | POA: Diagnosis not present

## 2017-12-19 DIAGNOSIS — Y998 Other external cause status: Secondary | ICD-10-CM | POA: Insufficient documentation

## 2017-12-19 DIAGNOSIS — Z79899 Other long term (current) drug therapy: Secondary | ICD-10-CM | POA: Diagnosis not present

## 2017-12-19 DIAGNOSIS — Y9301 Activity, walking, marching and hiking: Secondary | ICD-10-CM | POA: Diagnosis not present

## 2017-12-19 DIAGNOSIS — Y929 Unspecified place or not applicable: Secondary | ICD-10-CM | POA: Insufficient documentation

## 2017-12-19 DIAGNOSIS — O26891 Other specified pregnancy related conditions, first trimester: Secondary | ICD-10-CM | POA: Insufficient documentation

## 2017-12-19 DIAGNOSIS — O9989 Other specified diseases and conditions complicating pregnancy, childbirth and the puerperium: Secondary | ICD-10-CM | POA: Insufficient documentation

## 2017-12-19 DIAGNOSIS — O9A211 Injury, poisoning and certain other consequences of external causes complicating pregnancy, first trimester: Secondary | ICD-10-CM | POA: Diagnosis not present

## 2017-12-19 DIAGNOSIS — Z3490 Encounter for supervision of normal pregnancy, unspecified, unspecified trimester: Secondary | ICD-10-CM

## 2017-12-19 DIAGNOSIS — S93492A Sprain of other ligament of left ankle, initial encounter: Secondary | ICD-10-CM | POA: Insufficient documentation

## 2017-12-19 DIAGNOSIS — Z3A00 Weeks of gestation of pregnancy not specified: Secondary | ICD-10-CM | POA: Diagnosis not present

## 2017-12-19 DIAGNOSIS — W1842XA Slipping, tripping and stumbling without falling due to stepping into hole or opening, initial encounter: Secondary | ICD-10-CM | POA: Insufficient documentation

## 2017-12-19 DIAGNOSIS — Z3A01 Less than 8 weeks gestation of pregnancy: Secondary | ICD-10-CM | POA: Diagnosis not present

## 2017-12-19 LAB — CBC WITH DIFFERENTIAL/PLATELET
Abs Immature Granulocytes: 0.02 10*3/uL (ref 0.00–0.07)
Basophils Absolute: 0 10*3/uL (ref 0.0–0.1)
Basophils Relative: 1 %
Eosinophils Absolute: 0.2 10*3/uL (ref 0.0–0.5)
Eosinophils Relative: 3 %
HCT: 40.6 % (ref 36.0–46.0)
Hemoglobin: 13.8 g/dL (ref 12.0–15.0)
Immature Granulocytes: 0 %
Lymphocytes Relative: 29 %
Lymphs Abs: 2.2 10*3/uL (ref 0.7–4.0)
MCH: 28.5 pg (ref 26.0–34.0)
MCHC: 34 g/dL (ref 30.0–36.0)
MCV: 83.7 fL (ref 80.0–100.0)
Monocytes Absolute: 0.5 10*3/uL (ref 0.1–1.0)
Monocytes Relative: 7 %
Neutro Abs: 4.7 10*3/uL (ref 1.7–7.7)
Neutrophils Relative %: 60 %
Platelets: 329 10*3/uL (ref 150–400)
RBC: 4.85 MIL/uL (ref 3.87–5.11)
RDW: 12 % (ref 11.5–15.5)
WBC: 7.8 10*3/uL (ref 4.0–10.5)
nRBC: 0 % (ref 0.0–0.2)

## 2017-12-19 LAB — COMPREHENSIVE METABOLIC PANEL
ALT: 29 U/L (ref 0–44)
AST: 22 U/L (ref 15–41)
Albumin: 4.2 g/dL (ref 3.5–5.0)
Alkaline Phosphatase: 65 U/L (ref 38–126)
Anion gap: 10 (ref 5–15)
BUN: 10 mg/dL (ref 6–20)
CO2: 24 mmol/L (ref 22–32)
Calcium: 9 mg/dL (ref 8.9–10.3)
Chloride: 104 mmol/L (ref 98–111)
Creatinine, Ser: 0.67 mg/dL (ref 0.44–1.00)
GFR calc Af Amer: >60 mL/min (ref >60)
GFR calc non Af Amer: >60 mL/min (ref >60)
Glucose, Bld: 91 mg/dL (ref 70–99)
Potassium: 3.7 mmol/L (ref 3.5–5.1)
Sodium: 138 mmol/L (ref 135–145)
Total Bilirubin: 0.4 mg/dL (ref 0.3–1.2)
Total Protein: 7.8 g/dL (ref 6.5–8.1)

## 2017-12-19 LAB — URINALYSIS, COMPLETE (UACMP) WITH MICROSCOPIC
Bilirubin Urine: NEGATIVE
Glucose, UA: NEGATIVE mg/dL
Hgb urine dipstick: NEGATIVE
Ketones, ur: NEGATIVE mg/dL
Leukocytes, UA: NEGATIVE
Nitrite: NEGATIVE
Protein, ur: NEGATIVE mg/dL
Specific Gravity, Urine: 1.011 (ref 1.005–1.030)
pH: 6 (ref 5.0–8.0)

## 2017-12-19 LAB — HCG, SERUM, QUALITATIVE: hCG,Beta Subunit,Qual,Serum: POSITIVE m[IU]/mL — AB (ref <6)

## 2017-12-19 LAB — HCG, QUANTITATIVE, PREGNANCY: hCG, Beta Chain, Quant, S: 327 m[IU]/mL — ABNORMAL HIGH (ref <5)

## 2017-12-19 NOTE — ED Notes (Addendum)
See triage note  Presents s/p fall  States she stepped in hole   Felt a pop to left ankle  States she is able to bear wt  Good pulses  States she is early pregnant and is having abd cramping  No vaginal bleeding

## 2017-12-19 NOTE — ED Triage Notes (Signed)
C/O left ankle injury.  States stepped into a hole in yard last night and heard a "poppoing" sound.  Patient ambulatory and weightbearing.  NAD

## 2017-12-19 NOTE — ED Provider Notes (Signed)
[facility] Emergency Department Provider Note  ____________________________________________  Time seen: Approximately 5:29 PM  I have reviewed the triage vital signs and the nursing notes.   HISTORY  Chief Complaint Ankle Injury    HPI Olivia Thomas is a 22 y.o. female presents to the emergency department with multiple medical complaints.  Patient initially complained of left lateral and medial ankle pain after stepping into a hole while walking.  Patient was concern for fracture.  She denies numbness or tingling in the left lower extremity.  Patient secondarily complains that she is [redacted] weeks pregnant and has had 7 out of 10 lower pelvic pain for approximately 1 to 2 days.  No vaginal bleeding or changes in vaginal discharge.  Patient is concerned that this may be atypical.  No nausea or vomiting.  No abdominal trauma.  No alleviating measures of been attempted.   Past Medical History:  Diagnosis Date  . Anxiety   . Bipolar 1 disorder, depressed, full remission (HCC)   . Depression     Patient Active Problem List   Diagnosis Date Noted  . Irregular bleeding 09/28/2014  . Anxiety 09/28/2014    Past Surgical History:  Procedure Laterality Date  . NO PAST SURGERIES    . WISDOM TOOTH EXTRACTION      Prior to Admission medications   Medication Sig Start Date End Date Taking? Authorizing Provider  aspirin-acetaminophen-caffeine (EXCEDRIN MIGRAINE) [phone] MG tablet Take 1 tablet by mouth every 6 (six) hours as needed for headache. Patient not taking: Reported on 12/18/2017 11/21/17   Cuthriell, Delorise Royals, PA-C  divalproex (DEPAKOTE ER) 250 MG 24 hr tablet Take 3 tablets (750 mg total) by mouth at bedtime. 06/01/17   Jomarie Longs, MD  traZODone (DESYREL) 50 MG tablet Take 0.5-1 tablets (25-50 mg total) by mouth at bedtime as needed for sleep. Patient not taking: Reported on 12/18/2017 07/02/17   Jomarie Longs, MD    Allergies Kiwi  extract  Family History  Problem Relation Age of Onset  . Autoimmune disease Mother   . Anxiety disorder Mother   . Depression Mother   . Osteoarthritis Father   . Depression Father   . Bipolar disorder Maternal Aunt     Social History Social History   Tobacco Use  . Smoking status: Never Smoker  . Smokeless tobacco: Never Used  Substance Use Topics  . Alcohol use: Yes  . Drug use: No     Review of Systems  Constitutional: No fever/chills Eyes: No visual changes. No discharge ENT: No upper respiratory complaints. Cardiovascular: no chest pain. Respiratory: no cough. No SOB. Gastrointestinal: No abdominal pain.  No nausea, no vomiting.  No diarrhea.  No constipation. Genitourinary: Patient has pelvic pain.  Musculoskeletal: Patient has left ankle pain.  Skin: Negative for rash, abrasions, lacerations, ecchymosis. Neurological: Negative for headaches, focal weakness or numbness.   ____________________________________________   PHYSICAL EXAM:  VITAL SIGNS: ED Triage Vitals  Enc Vitals Group     BP --      Pulse --      Resp --      Temp --      Temp src --      SpO2 --      Weight 12/19/17 1436 189 lb 2.5 oz (85.8 kg)     Height --      Head Circumference --      Peak Flow --      Pain Score 12/19/17 1435 6  Pain Loc --      Pain Edu? --      Excl. in GC? --      Constitutional: Alert and oriented. Well appearing and in no acute distress. Eyes: Conjunctivae are normal. PERRL. EOMI. Head: Atraumatic. ENT:      Ears: TMs are pearly.      Nose: No congestion/rhinnorhea.      Mouth/Throat: Mucous membranes are moist.  Neck: No stridor.  No cervical spine tenderness to palpation. Cardiovascular: Normal rate, regular rhythm. Normal S1 and S2.  Good peripheral circulation. Respiratory: Normal respiratory effort without tachypnea or retractions. Lungs CTAB. Good air entry to the bases with no decreased or absent breath sounds. Gastrointestinal: Bowel  sounds 4 quadrants. Soft and nontender to palpation. No guarding or rigidity. No palpable masses. No distention. No CVA tenderness. Musculoskeletal: Patient is able to perform limited range of motion at the left ankle, likely secondary to pain.  She has tenderness to palpation over the anterior and posterior talofibular ligaments as well as the deltoid ligament.  No pain with palpation over the course of the left metatarsals.  Palpable dorsalis pedis pulse, left. Neurologic:  Normal speech and language. No gross focal neurologic deficits are appreciated.  Skin:  Skin is warm, dry and intact. No rash noted. Psychiatric: Mood and affect are normal. Speech and behavior are normal. Patient exhibits appropriate insight and judgement.   ____________________________________________   LABS (all labs ordered are listed, but only abnormal results are displayed)  Labs Reviewed  HCG, QUANTITATIVE, PREGNANCY - Abnormal; Notable for the following components:      Result Value   hCG, Beta Chain, Quant, S 327 (*)    All other components within normal limits  CBC WITH DIFFERENTIAL/PLATELET  COMPREHENSIVE METABOLIC PANEL  URINALYSIS, COMPLETE (UACMP) WITH MICROSCOPIC   ____________________________________________  EKG   ____________________________________________  RADIOLOGY I personally viewed and evaluated these images as part of my medical decision making, as well as reviewing the written report by the radiologist  Dg Ankle Complete Left  Result Date: 12/19/2017 CLINICAL DATA:  Left ankle pain after stepping into a hole last evening and hearing a pop. Patient is noted to be pregnant and was double shielded prior to acquiring these radiographs. EXAM: LEFT ANKLE COMPLETE - 3+ VIEW COMPARISON:  06/09/2010 FINDINGS: Soft tissue swelling over the lateral malleolus. Small ankle joint effusion. No acute displaced fracture. Base of fifth metatarsal appears intact. The tibiotalar, subtalar and midfoot  articulations appear congruent. IMPRESSION: Soft tissue swelling about the malleoli, more so laterally with small ankle joint effusion. No fracture or joint dislocation. Electronically Signed   By: Tollie Eth M.D.   On: 12/19/2017 15:25    ____________________________________________    PROCEDURES  Procedure(s) performed:    Procedures    Medications - No data to display   ____________________________________________   INITIAL IMPRESSION / ASSESSMENT AND PLAN / ED COURSE  Pertinent labs & imaging results that were available during my care of the patient were reviewed by me and considered in my medical decision making (see chart for details).  Review of the [place] CSRS was performed in accordance of the [facility] prior to dispensing any controlled drugs.      Assessment and Plan:  Left ankle sprain Pelvic pain Patient presents to the emergency department after sustaining an inversion type left ankle injury.  X-ray examination reveals no acute fractures or bony abnormalities.  Left ankle sprain is likely.  Ace wrap was applied in the emergency department and  Tylenol was recommended for discomfort as patient is pregnant.  Patient was also complaining of pelvic discomfort.  Patient is approximately [redacted] weeks pregnant.  Pelvic ultrasound was noncontributory for ectopic pregnancy or pelvic mass.  Strict return precautions were given to return to the emergency department for new or worsening symptoms.  Vital signs were reassuring throughout emergency department course.  All patient questions were answered.   ____________________________________________  FINAL CLINICAL IMPRESSION(S) / ED DIAGNOSES  Final diagnoses:  Pelvic pain      NEW MEDICATIONS STARTED DURING THIS VISIT:  ED Discharge Orders    None          This chart was dictated using voice recognition software/Dragon. Despite best efforts to proofread, errors can occur which can change the meaning. Any change was purely  unintentional.    Orvil Feil, PA-C 12/19/17 1904    Phineas Semen, MD 12/19/17 2106

## 2017-12-20 LAB — URINE CULTURE: Organism ID, Bacteria: NO GROWTH

## 2017-12-25 ENCOUNTER — Encounter: Payer: Self-pay | Admitting: Maternal Newborn

## 2017-12-31 ENCOUNTER — Encounter: Payer: Self-pay | Admitting: Obstetrics and Gynecology

## 2017-12-31 ENCOUNTER — Ambulatory Visit (INDEPENDENT_AMBULATORY_CARE_PROVIDER_SITE_OTHER): Payer: BLUE CROSS/BLUE SHIELD | Admitting: Obstetrics and Gynecology

## 2017-12-31 ENCOUNTER — Other Ambulatory Visit (HOSPITAL_COMMUNITY)
Admission: RE | Admit: 2017-12-31 | Discharge: 2017-12-31 | Disposition: A | Discharge status: Home / Self Care | Payer: BLUE CROSS/BLUE SHIELD | Source: Ambulatory Visit | Attending: Obstetrics and Gynecology | Admitting: Obstetrics and Gynecology

## 2017-12-31 VITALS — BP 118/78 | Wt 194.0 lb

## 2017-12-31 DIAGNOSIS — F319 Bipolar disorder, unspecified: Secondary | ICD-10-CM

## 2017-12-31 DIAGNOSIS — Z124 Encounter for screening for malignant neoplasm of cervix: Secondary | ICD-10-CM

## 2017-12-31 DIAGNOSIS — O99210 Obesity complicating pregnancy, unspecified trimester: Secondary | ICD-10-CM | POA: Insufficient documentation

## 2017-12-31 DIAGNOSIS — Z3A01 Less than 8 weeks gestation of pregnancy: Secondary | ICD-10-CM

## 2017-12-31 DIAGNOSIS — Z113 Encounter for screening for infections with a predominantly sexual mode of transmission: Secondary | ICD-10-CM | POA: Insufficient documentation

## 2017-12-31 DIAGNOSIS — O0990 Supervision of high risk pregnancy, unspecified, unspecified trimester: Secondary | ICD-10-CM

## 2017-12-31 DIAGNOSIS — O99340 Other mental disorders complicating pregnancy, unspecified trimester: Secondary | ICD-10-CM

## 2017-12-31 DIAGNOSIS — Z6830 Body mass index (BMI) 30.0-30.9, adult: Secondary | ICD-10-CM

## 2017-12-31 DIAGNOSIS — O99211 Obesity complicating pregnancy, first trimester: Secondary | ICD-10-CM

## 2017-12-31 DIAGNOSIS — O99341 Other mental disorders complicating pregnancy, first trimester: Secondary | ICD-10-CM
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
## 2018-01-02 LAB — RPR+RH+ABO+RUB AB+AB SCR+CB...
Antibody Screen: NEGATIVE
HIV Screen 4th Generation wRfx: NONREACTIVE
Hematocrit: 37.9 % (ref 34.0–46.6)
Hemoglobin: 12.7 g/dL (ref 11.1–15.9)
Hepatitis B Surface Ag: NEGATIVE
MCH: 27.8 pg (ref 26.6–33.0)
MCHC: 33.5 g/dL (ref 31.5–35.7)
MCV: 83 fL (ref 79–97)
Platelets: 320 10*3/uL (ref 150–450)
RBC: 4.57 x10E6/uL (ref 3.77–5.28)
RDW: 12.9 % (ref 12.3–15.4)
RPR Ser Ql: NONREACTIVE
Rh Factor: NEGATIVE
Rubella Antibodies, IGG: <0.9 index — ABNORMAL LOW (ref >0.99)
Varicella zoster IgG: 198 index (ref >165)
WBC: 8.3 10*3/uL (ref 3.4–10.8)

## 2018-01-02 LAB — HEMOGLOBINOPATHY EVALUATION
HGB C: 0 %
HGB S: 0 %
HGB VARIANT: 0 %
Hemoglobin A2 Quantitation: 1.9 % (ref 1.8–3.2)
Hemoglobin F Quantitation: 0 % (ref 0.0–2.0)
Hgb A: 98.1 % (ref 96.4–98.8)

## 2018-01-02 LAB — URINE DRUG PANEL 7
Amphetamines, Urine: NEGATIVE ng/mL
Barbiturate Quant, Ur: NEGATIVE ng/mL
Benzodiazepine Quant, Ur: NEGATIVE ng/mL
Cannabinoid Quant, Ur: NEGATIVE ng/mL
Cocaine (Metab.): NEGATIVE ng/mL
Opiate Quant, Ur: NEGATIVE ng/mL
PCP Quant, Ur: NEGATIVE ng/mL

## 2018-01-02 LAB — BETA HCG QUANT (REF LAB): hCG Quant: 8480 m[IU]/mL

## 2018-01-03 LAB — URINE CULTURE

## 2018-01-04 LAB — CYTOLOGY - PAP
Chlamydia: NEGATIVE
Diagnosis: NEGATIVE
Neisseria Gonorrhea: NEGATIVE

## 2018-01-07 ENCOUNTER — Ambulatory Visit (INDEPENDENT_AMBULATORY_CARE_PROVIDER_SITE_OTHER): Payer: BLUE CROSS/BLUE SHIELD

## 2018-01-07 ENCOUNTER — Ambulatory Visit (INDEPENDENT_AMBULATORY_CARE_PROVIDER_SITE_OTHER): Payer: BLUE CROSS/BLUE SHIELD | Admitting: Maternal Newborn

## 2018-01-07 ENCOUNTER — Other Ambulatory Visit: Payer: Self-pay | Admitting: Obstetrics and Gynecology

## 2018-01-07 VITALS — BP 90/60 | Wt 191.5 lb

## 2018-01-07 DIAGNOSIS — Z3A01 Less than 8 weeks gestation of pregnancy: Secondary | ICD-10-CM | POA: Diagnosis not present

## 2018-01-07 DIAGNOSIS — O3680X0 Pregnancy with inconclusive fetal viability, not applicable or unspecified: Secondary | ICD-10-CM

## 2018-01-07 DIAGNOSIS — O0990 Supervision of high risk pregnancy, unspecified, unspecified trimester: Secondary | ICD-10-CM

## 2018-01-07 DIAGNOSIS — O99341 Other mental disorders complicating pregnancy, first trimester: Secondary | ICD-10-CM

## 2018-01-07 LAB — POCT URINALYSIS DIPSTICK OB
Glucose, UA: NEGATIVE
Ketones, UA: NEGATIVE
POC,PROTEIN,UA: NEGATIVE

## 2018-01-07 NOTE — Progress Notes (Signed)
C/o has been really nauseaus - not able to eat in the last week and a half.rj

## 2018-01-07 NOTE — Progress Notes (Signed)
    Routine Prenatal Care Visit  Subjective  Olivia Thomas is a 22 y.o. G1P0 at [redacted]w[redacted]d being seen today for ongoing prenatal care.  She is currently monitored for the following issues for this high-risk pregnancy and has Irregular bleeding; Anxiety; Supervision of high risk pregnancy, antepartum; Bipolar disease during pregnancy (HCC); Obesity affecting pregnancy; and BMI 30.0-30.9,adult on their problem list.  ----------------------------------------------------------------------------------- Patient reports nausea and difficulty eating..   Vag. Bleeding: None.  ----------------------------------------------------------------------------------- The following portions of the patient's history were reviewed and updated as appropriate: allergies, current medications, past family history, past medical history, past social history, past surgical history and problem list. Problem list updated.  Objective  Blood pressure 90/60, weight 191 lb 8 oz (86.9 kg). Pregravid weight 194 lb (88 kg) Total Weight Gain -2 lb 8 oz (-1.134 kg) Body mass index is 29.99 kg/m.   Urinalysis: Protein Negative, Glucose Negative  Fetal Status: Fetal Heart Rate (bpm): 134         General:  Alert, oriented and cooperative. Patient is in no acute distress.  Skin: Skin is warm and dry. No rash noted.   Cardiovascular: Normal heart rate noted  Respiratory: Normal respiratory effort, no problems with respiration noted  Abdomen: Soft, gravid, appropriate for gestational age.       Pelvic:  Cervical exam deferred        Extremities: Normal range of motion.     Mental Status: Normal mood and affect. Normal behavior. Normal judgment and thought content.    Assessment   22 y.o. G1P0 at [redacted]w[redacted]d, EDD 08/29/2018 by Ultrasound presenting for a routine prenatal visit.  Plan   pregnancy Problems (from 12/31/17 to present)    Problem Noted Resolved   Supervision of high risk pregnancy, antepartum 12/31/2017 by Conard Novak, MD No   Bipolar disease during pregnancy [facility]) 12/31/2017 by Conard Novak, MD No   Overview Signed 12/31/2017  3:23 PM by Conard Novak, MD    Patient states she has frequent psychotic episodes.  She is managed by Dr. Elna Breslow.       Obesity affecting pregnancy 12/31/2017 by Conard Novak, MD No   BMI 30.0-30.9,adult 12/31/2017 by Conard Novak, MD No    Ultrasound today shows a singleton IUP at 6w 4d gestation with FHR at 134 bpm.  Bilateral ovaries appear to have PCOS.  Endometrial tissue has an appearance that is abnormal, and the  report states that a partial molar pregnancy cannot be ruled out at this time. After consultation with MD, the plan is to follow serial beta hCG values and ultrasounds to monitor for abnormalities. Discussed this plan with Olivia Thomas today.  Bonjesta samples given for persistent nausea, will send Rx if effective.  Please refer to After Visit Summary for other counseling recommendations.   Return in about 1 week (around 01/14/2018) for ROB with ultrasound/lab.  Marcelyn Bruins, CNM 01/07/2018  9:14 AM

## 2018-01-08 ENCOUNTER — Encounter: Payer: Self-pay | Admitting: Maternal Newborn

## 2018-01-08 ENCOUNTER — Other Ambulatory Visit: Payer: Self-pay | Admitting: Advanced Practice Midwife

## 2018-01-08 DIAGNOSIS — O0991 Supervision of high risk pregnancy, unspecified, first trimester: Secondary | ICD-10-CM

## 2018-01-08 LAB — BETA HCG QUANT (REF LAB): hCG Quant: 20755 m[IU]/mL

## 2018-01-08 NOTE — Patient Instructions (Signed)
First Trimester of Pregnancy The first trimester of pregnancy is from week 1 until the end of week 13 (months 1 through 3). A week after a sperm fertilizes an egg, the egg will implant on the wall of the uterus. This embryo will begin to develop into a baby. Genes from you and your partner will form the baby. The female genes will determine whether the baby will be a boy or a girl. At 6-8 weeks, the eyes and face will be formed, and the heartbeat can be seen on ultrasound. At the end of 12 weeks, all the baby's organs will be formed. Now that you are pregnant, you will want to do everything you can to have a healthy baby. Two of the most important things are to get good prenatal care and to follow your health care provider's instructions. Prenatal care is all the medical care you receive before the baby's birth. This care will help prevent, find, and treat any problems during the pregnancy and childbirth. Body changes during your first trimester Your body goes through many changes during pregnancy. The changes vary from woman to woman.  You may gain or lose a couple of pounds at first.  You may feel sick to your stomach (nauseous) and you may throw up (vomit). If the vomiting is uncontrollable, call your health care provider.  You may tire easily.  You may develop headaches that can be relieved by medicines. All medicines should be approved by your health care provider.  You may urinate more often. Painful urination may mean you have a bladder infection.  You may develop heartburn as a result of your pregnancy.  You may develop constipation because certain hormones are causing the muscles that push stool through your intestines to slow down.  You may develop hemorrhoids or swollen veins (varicose veins).  Your breasts may begin to grow larger and become tender. Your nipples may stick out more, and the tissue that surrounds them (areola) may become darker.  Your gums may bleed and may be  sensitive to brushing and flossing.  Dark spots or blotches (chloasma, mask of pregnancy) may develop on your face. This will likely fade after the baby is born.  Your menstrual periods will stop.  You may have a loss of appetite.  You may develop cravings for certain kinds of food.  You may have changes in your emotions from day to day, such as being excited to be pregnant or being concerned that something may go wrong with the pregnancy and baby.  You may have more vivid and strange dreams.  You may have changes in your hair. These can include thickening of your hair, rapid growth, and changes in texture. Some women also have hair loss during or after pregnancy, or hair that feels dry or thin. Your hair will most likely return to normal after your baby is born.  What to expect at prenatal visits During a routine prenatal visit:  You will be weighed to make sure you and the baby are growing normally.  Your blood pressure will be taken.  Your abdomen will be measured to track your baby's growth.  The fetal heartbeat will be listened to between weeks 10 and 14 of your pregnancy.  Test results from any previous visits will be discussed.  Your health care provider may ask you:  How you are feeling.  If you are feeling the baby move.  If you have had any abnormal symptoms, such as leaking fluid, bleeding, severe headaches,   or abdominal cramping.  If you are using any tobacco products, including cigarettes, chewing tobacco, and electronic cigarettes.  If you have any questions.  Other tests that may be performed during your first trimester include:  Blood tests to find your blood type and to check for the presence of any previous infections. The tests will also be used to check for low iron levels (anemia) and protein on red blood cells (Rh antibodies). Depending on your risk factors, or if you previously had diabetes during pregnancy, you may have tests to check for high blood  sugar that affects pregnant women (gestational diabetes).  Urine tests to check for infections, diabetes, or protein in the urine.  An ultrasound to confirm the proper growth and development of the baby.  Fetal screens for spinal cord problems (spina bifida) and Down syndrome.  HIV (human immunodeficiency virus) testing. Routine prenatal testing includes screening for HIV, unless you choose not to have this test.  You may need other tests to make sure you and the baby are doing well.  Follow these instructions at home: Medicines  Follow your health care provider's instructions regarding medicine use. Specific medicines may be either safe or unsafe to take during pregnancy.  Take a prenatal vitamin that contains at least 600 micrograms (mcg) of folic acid.  If you develop constipation, try taking a stool softener if your health care provider approves. Eating and drinking  Eat a balanced diet that includes fresh fruits and vegetables, whole grains, good sources of protein such as meat, eggs, or tofu, and low-fat dairy. Your health care provider will help you determine the amount of weight gain that is right for you.  Avoid raw meat and uncooked cheese. These carry germs that can cause birth defects in the baby.  Eating four or five small meals rather than three large meals a day may help relieve nausea and vomiting. If you start to feel nauseous, eating a few soda crackers can be helpful. Drinking liquids between meals, instead of during meals, also seems to help ease nausea and vomiting.  Limit foods that are high in fat and processed sugars, such as fried and sweet foods.  To prevent constipation: ? Eat foods that are high in fiber, such as fresh fruits and vegetables, whole grains, and beans. ? Drink enough fluid to keep your urine clear or pale yellow. Activity  Exercise only as directed by your health care provider. Most women can continue their usual exercise routine during  pregnancy. Try to exercise for 30 minutes at least 5 days a week. Exercising will help you: ? Control your weight. ? Stay in shape. ? Be prepared for labor and delivery.  Experiencing pain or cramping in the lower abdomen or lower back is a good sign that you should stop exercising. Check with your health care provider before continuing with normal exercises.  Try to avoid standing for long periods of time. Move your legs often if you must stand in one place for a long time.  Avoid heavy lifting.  Wear low-heeled shoes and practice good posture.  You may continue to have sex unless your health care provider tells you not to. Relieving pain and discomfort  Wear a good support bra to relieve breast tenderness.  Take warm sitz baths to soothe any pain or discomfort caused by hemorrhoids. Use hemorrhoid cream if your health care provider approves.  Rest with your legs elevated if you have leg cramps or low back pain.  If you develop   varicose veins in your legs, wear support hose. Elevate your feet for 15 minutes, 3-4 times a day. Limit salt in your diet. Prenatal care  Schedule your prenatal visits by the twelfth week of pregnancy. They are usually scheduled monthly at first, then more often in the last 2 months before delivery.  Write down your questions. Take them to your prenatal visits.  Keep all your prenatal visits as told by your health care provider. This is important. Safety  Wear your seat belt at all times when driving.  Make a list of emergency phone numbers, including numbers for family, friends, the hospital, and police and fire departments. General instructions  Ask your health care provider for a referral to a local prenatal education class. Begin classes no later than the beginning of month 6 of your pregnancy.  Ask for help if you have counseling or nutritional needs during pregnancy. Your health care provider can offer advice or refer you to specialists for help  with various needs.  Do not use hot tubs, steam rooms, or saunas.  Do not douche or use tampons or scented sanitary pads.  Do not cross your legs for long periods of time.  Avoid cat litter boxes and soil used by cats. These carry germs that can cause birth defects in the baby and possibly loss of the fetus by miscarriage or stillbirth.  Avoid all smoking, herbs, alcohol, and medicines not prescribed by your health care provider. Chemicals in these products affect the formation and growth of the baby.  Do not use any products that contain nicotine or tobacco, such as cigarettes and e-cigarettes. If you need help quitting, ask your health care provider. You may receive counseling support and other resources to help you quit.  Schedule a dentist appointment. At home, brush your teeth with a soft toothbrush and be gentle when you floss. Contact a health care provider if:  You have dizziness.  You have mild pelvic cramps, pelvic pressure, or nagging pain in the abdominal area.  You have persistent nausea, vomiting, or diarrhea.  You have a bad smelling vaginal discharge.  You have pain when you urinate.  You notice increased swelling in your face, hands, legs, or ankles.  You are exposed to fifth disease or chickenpox.  You are exposed to German measles (rubella) and have never had it. Get help right away if:  You have a fever.  You are leaking fluid from your vagina.  You have spotting or bleeding from your vagina.  You have severe abdominal cramping or pain.  You have rapid weight gain or loss.  You vomit blood or material that looks like coffee grounds.  You develop a severe headache.  You have shortness of breath.  You have any kind of trauma, such as from a fall or a car accident. Summary  The first trimester of pregnancy is from week 1 until the end of week 13 (months 1 through 3).  Your body goes through many changes during pregnancy. The changes vary from  woman to woman.  You will have routine prenatal visits. During those visits, your health care provider will examine you, discuss any test results you may have, and talk with you about how you are feeling. This information is not intended to replace advice given to you by your health care provider. Make sure you discuss any questions you have with your health care provider. Document Released: 02/21/2001 Document Revised: 02/09/2016 Document Reviewed: 02/09/2016 Elsevier Interactive Patient Education  2018 [employer]  

## 2018-01-14 ENCOUNTER — Encounter: Payer: Self-pay | Admitting: Advanced Practice Midwife

## 2018-01-14 ENCOUNTER — Other Ambulatory Visit: Payer: Self-pay | Admitting: Obstetrics and Gynecology

## 2018-01-14 ENCOUNTER — Ambulatory Visit (INDEPENDENT_AMBULATORY_CARE_PROVIDER_SITE_OTHER): Payer: BLUE CROSS/BLUE SHIELD | Admitting: Advanced Practice Midwife

## 2018-01-14 ENCOUNTER — Telehealth: Payer: Self-pay | Admitting: Obstetrics and Gynecology

## 2018-01-14 ENCOUNTER — Ambulatory Visit (INDEPENDENT_AMBULATORY_CARE_PROVIDER_SITE_OTHER): Payer: BLUE CROSS/BLUE SHIELD

## 2018-01-14 VITALS — BP 110/64 | Wt 192.0 lb

## 2018-01-14 DIAGNOSIS — Z3A01 Less than 8 weeks gestation of pregnancy: Secondary | ICD-10-CM | POA: Diagnosis not present

## 2018-01-14 DIAGNOSIS — O3680X0 Pregnancy with inconclusive fetal viability, not applicable or unspecified: Secondary | ICD-10-CM

## 2018-01-14 DIAGNOSIS — O99211 Obesity complicating pregnancy, first trimester: Secondary | ICD-10-CM

## 2018-01-14 DIAGNOSIS — O0991 Supervision of high risk pregnancy, unspecified, first trimester: Secondary | ICD-10-CM

## 2018-01-14 DIAGNOSIS — O008 Other ectopic pregnancy without intrauterine pregnancy: Secondary | ICD-10-CM

## 2018-01-14 NOTE — Progress Notes (Signed)
Routine Prenatal Care Visit  Subjective  Olivia Thomas is a 22 y.o. G1P0 at [redacted]w[redacted]d being seen today for ongoing prenatal care.  She is currently monitored for the following issues for this high-risk pregnancy and has Irregular bleeding; Anxiety; Supervision of high risk pregnancy, antepartum; Bipolar disease during pregnancy (HCC); Obesity affecting pregnancy; and BMI 30.0-30.9,adult on their problem list.  ----------------------------------------------------------------------------------- Patient reports feeling well. Reviewed results of ultrasound and will consult with MD regarding endometrium and get back to her on interpretation. When asked if she had decided to do genetic screening she revealed that she and her fiance are uncertain if they are keeping the pregnancy. .    Lockie Pares. Bleeding: None.   . Denies leaking of fluid.  ----------------------------------------------------------------------------------- The following portions of the patient's history were reviewed and updated as appropriate: allergies, current medications, past family history, past medical history, past social history, past surgical history and problem list. Problem list updated.   Objective  Blood pressure 110/64, weight 192 lb (87.1 kg). Pregravid weight 194 lb (88 kg) Total Weight Gain -2 lb (-0.907 kg) Urinalysis: Urine Protein    Urine Glucose    Fetal Status: Fetal Heart Rate (bpm): 169          Patient Name: Olivia Thomas DOB: 1995-11-23 MRN: [id number]  ULTRASOUND REPORT  Location: [facility] Date of Service: 01/14/2018   Indications: Follow up  Findings:  Singleton intrauterine pregnancy is visualized with a CRL consistent with [redacted]w[redacted]d gestation, giving an (U/S) EDD of 08/29/2018. The (U/S) EDD is consistent with the clinically established EDD of 08/29/2018.  Gestational sac appears to be located more to the left and superior; however still appears to be within the endometrium not  cornua. Endometrium appears unchanged from prior heterogeneous and thickened. FHR: 169 BPM CRL measurement: 12.7 mm Yolk sac is visualized and appears normal and early anatomy is normal. Amnion: visualized and appears normal   Ultrasound appearance of PCOS; otherwise normal appearing ovaries. Survey of the adnexa demonstrates no adnexal masses. Small amount of free fluid near the right ovary.  Impression: 1. [redacted]w[redacted]d Viable Singleton Intrauterine pregnancy by U/S. 2. (U/S) EDD is consistent with Clinically established EDD of 08/29/2018 3. Endometrium appears unchanged from prior.  Recommendations: 1.Clinical correlation with the patient's History and Physical Exam.  Darlina Guys, RDMS RVT  General:  Alert, oriented and cooperative. Patient is in no acute distress.  Skin: Skin is warm and dry. No rash noted.   Cardiovascular: Normal heart rate noted  Respiratory: Normal respiratory effort, no problems with respiration noted  Abdomen: Soft, gravid, appropriate for gestational age.       Pelvic:  Cervical exam deferred        Extremities: Normal range of motion.     Mental Status: Normal mood and affect. Normal behavior. Normal judgment and thought content.   Assessment   22 y.o. G1P0 at [redacted]w[redacted]d by  08/29/2018, by Ultrasound presenting for routine prenatal visit  Plan   pregnancy Problems (from 12/31/17 to present)    Problem Noted Resolved   Supervision of high risk pregnancy, antepartum 12/31/2017 by Conard Novak, MD No   Bipolar disease during pregnancy (HCC) 12/31/2017 by Conard Novak, MD No   Overview Signed 12/31/2017  3:23 PM by Conard Novak, MD    Patient states she has frequent psychotic episodes.  She is managed by Dr. Elna Breslow.       Obesity affecting pregnancy 12/31/2017 by Conard Novak, MD No  BMI 30.0-30.9,adult 12/31/2017 by Conard Novak, MD No       Preterm labor symptoms and general obstetric precautions including but not  limited to vaginal bleeding, contractions, leaking of fluid and fetal movement were reviewed in detail with the patient.    Return in about 4 weeks (around 02/11/2018) for rob.  Tresea Mall, CNM 01/14/2018 9:11 AM

## 2018-01-14 NOTE — Telephone Encounter (Signed)
Called patient. Left message asking her to return call as soon as possible. Would like to speak with her even if I am in the room with another patient.   Adelene Idler MD [facility], [facility] 01/14/18 1:27 PM

## 2018-01-14 NOTE — Progress Notes (Signed)
Discussed with patient on the phone possibility of an ectopic or cornual pregnancy. Discussed ectopic precautions. Will refer to [facility] for further management.   Adelene Idler MD [facility], [facility] 01/14/18 2:28 PM

## 2018-01-14 NOTE — Progress Notes (Signed)
U/s today. No vb. No lof.  

## 2018-01-15 DIAGNOSIS — Z3A01 Less than 8 weeks gestation of pregnancy: Secondary | ICD-10-CM | POA: Diagnosis not present

## 2018-01-15 DIAGNOSIS — O008 Other ectopic pregnancy without intrauterine pregnancy: Secondary | ICD-10-CM | POA: Diagnosis not present

## 2018-01-17 ENCOUNTER — Other Ambulatory Visit: Payer: Self-pay | Admitting: Obstetrics and Gynecology

## 2018-01-17 DIAGNOSIS — O0990 Supervision of high risk pregnancy, unspecified, unspecified trimester: Secondary | ICD-10-CM

## 2018-01-25 DIAGNOSIS — Z3A09 9 weeks gestation of pregnancy: Secondary | ICD-10-CM | POA: Diagnosis not present

## 2018-01-25 DIAGNOSIS — R1032 Left lower quadrant pain: Secondary | ICD-10-CM | POA: Diagnosis not present

## 2018-01-25 DIAGNOSIS — O3680X0 Pregnancy with inconclusive fetal viability, not applicable or unspecified: Secondary | ICD-10-CM | POA: Diagnosis not present

## 2018-01-25 DIAGNOSIS — R102 Pelvic and perineal pain: Secondary | ICD-10-CM | POA: Diagnosis not present

## 2018-01-25 DIAGNOSIS — O26891 Other specified pregnancy related conditions, first trimester: Secondary | ICD-10-CM | POA: Diagnosis not present

## 2018-02-11 ENCOUNTER — Encounter: Payer: BLUE CROSS/BLUE SHIELD | Admitting: Maternal Newborn

## 2018-02-18 ENCOUNTER — Encounter: Payer: Self-pay | Admitting: Emergency Medicine

## 2018-02-18 ENCOUNTER — Other Ambulatory Visit: Payer: Self-pay

## 2018-02-18 ENCOUNTER — Ambulatory Visit
Admission: EM | Admit: 2018-02-18 | Discharge: 2018-02-18 | Disposition: A | Discharge status: Other Acute Inpatient Hospital | Payer: BLUE CROSS/BLUE SHIELD | Source: Home / Self Care | Attending: Family Medicine | Admitting: Family Medicine

## 2018-02-18 ENCOUNTER — Emergency Department
Admission: EM | Admit: 2018-02-18 | Discharge: 2018-02-19 | Disposition: A | Discharge status: Home / Self Care | Payer: BLUE CROSS/BLUE SHIELD | Attending: Emergency Medicine | Admitting: Emergency Medicine

## 2018-02-18 DIAGNOSIS — F319 Bipolar disorder, unspecified: Secondary | ICD-10-CM | POA: Diagnosis not present

## 2018-02-18 DIAGNOSIS — R443 Hallucinations, unspecified: Secondary | ICD-10-CM | POA: Diagnosis not present

## 2018-02-18 DIAGNOSIS — R441 Visual hallucinations: Secondary | ICD-10-CM | POA: Insufficient documentation

## 2018-02-18 DIAGNOSIS — R440 Auditory hallucinations: Secondary | ICD-10-CM | POA: Diagnosis not present

## 2018-02-18 DIAGNOSIS — Z7982 Long term (current) use of aspirin: Secondary | ICD-10-CM | POA: Diagnosis not present

## 2018-02-18 LAB — CBC
HCT: 39.2 % (ref 36.0–46.0)
Hemoglobin: 13 g/dL (ref 12.0–15.0)
MCH: 28.3 pg (ref 26.0–34.0)
MCHC: 33.2 g/dL (ref 30.0–36.0)
MCV: 85.4 fL (ref 80.0–100.0)
Platelets: 347 10*3/uL (ref 150–400)
RBC: 4.59 MIL/uL (ref 3.87–5.11)
RDW: 12.1 % (ref 11.5–15.5)
WBC: 8.8 10*3/uL (ref 4.0–10.5)
nRBC: 0 % (ref 0.0–0.2)

## 2018-02-18 LAB — URINE DRUG SCREEN, QUALITATIVE (ARMC ONLY)
Amphetamines, Ur Screen: NOT DETECTED
Barbiturates, Ur Screen: NOT DETECTED
Benzodiazepine, Ur Scrn: NOT DETECTED
Cannabinoid 50 Ng, Ur ~~LOC~~: NOT DETECTED
Cocaine Metabolite,Ur ~~LOC~~: NOT DETECTED
MDMA (Ecstasy)Ur Screen: NOT DETECTED
Methadone Scn, Ur: NOT DETECTED
Opiate, Ur Screen: NOT DETECTED
Phencyclidine (PCP) Ur S: NOT DETECTED
Tricyclic, Ur Screen: NOT DETECTED

## 2018-02-18 LAB — COMPREHENSIVE METABOLIC PANEL
ALT: 31 U/L (ref 0–44)
AST: 25 U/L (ref 15–41)
Albumin: 4.5 g/dL (ref 3.5–5.0)
Alkaline Phosphatase: 42 U/L (ref 38–126)
Anion gap: 8 (ref 5–15)
BUN: 9 mg/dL (ref 6–20)
CO2: 25 mmol/L (ref 22–32)
Calcium: 9.3 mg/dL (ref 8.9–10.3)
Chloride: 106 mmol/L (ref 98–111)
Creatinine, Ser: 0.7 mg/dL (ref 0.44–1.00)
GFR calc Af Amer: >60 mL/min (ref >60)
GFR calc non Af Amer: >60 mL/min (ref >60)
Glucose, Bld: 90 mg/dL (ref 70–99)
Potassium: 3.6 mmol/L (ref 3.5–5.1)
Sodium: 139 mmol/L (ref 135–145)
Total Bilirubin: 0.6 mg/dL (ref 0.3–1.2)
Total Protein: 7.8 g/dL (ref 6.5–8.1)

## 2018-02-18 LAB — ETHANOL: Alcohol, Ethyl (B): <10 mg/dL (ref <10)

## 2018-02-18 LAB — ACETAMINOPHEN LEVEL: Acetaminophen (Tylenol), Serum: <10 ug/mL — ABNORMAL LOW (ref 10–30)

## 2018-02-18 LAB — SALICYLATE LEVEL: Salicylate Lvl: <7 mg/dL (ref 2.8–30.0)

## 2018-02-18 LAB — POCT PREGNANCY, URINE: Preg Test, Ur: NEGATIVE

## 2018-02-18 NOTE — ED Notes (Addendum)
1 pair boots, 1 pair socks, 1 pair leggings, 1 bra, 1 shirt, 1 skirt 1 phone. Olivia Thomas HerFiancee erik kooistra took all other belongings. His phone number [phone]

## 2018-02-18 NOTE — ED Notes (Signed)
Pt calm, cooperative and pleasant with this Clinical research associatewriter. Pt stated she had a "psychotic break" today. Pt reports she felt "like the world was a video game" for 2 hours while at work. Pt was taken off her psychiatric medications while pregnant.  Pt believes she needs to have her meds restarted.  Pt denies SI/HI.  No AVH at this time.   Maintained on 15 minute checks and observation by security for safety.

## 2018-02-18 NOTE — ED Notes (Signed)
Report to SOC MD 

## 2018-02-18 NOTE — ED Provider Notes (Signed)
[facility] Emergency Department Provider Note ____________________________________________   First MD Initiated Contact with Patient 02/18/18 1846     (approximate)  I have reviewed the triage vital signs and the nursing notes.   HISTORY  Chief Complaint Psychiatric Evaluation    HPI Olivia Thomas is a 22 y.o. female With PMH as noted below who presents with what she calls a "psychotic break" consisting of both visual and auditory hallucinations, acute onset while she was at work.  The patient states that it felt like her life is a videogame, and she heard the voices telling her that she could not leave.  She states it lasted about 2 hours and has now resolved.  She states that she has not had an episode similar to this.  The patient was previously on psychiatric medications but was taken off of them during her pregnancy.  However she has since had an abortion and is no longer pregnant.  She denies any acute medical complaints.  Past Medical History:  Diagnosis Date  . Anxiety   . Bipolar 1 disorder, depressed, full remission (HCC)   . Depression     Patient Active Problem List   Diagnosis Date Noted  . Supervision of high risk pregnancy, antepartum 12/31/2017  . Bipolar disease during pregnancy (HCC) 12/31/2017  . Obesity affecting pregnancy 12/31/2017  . BMI 30.0-30.9,adult 12/31/2017  . Irregular bleeding 09/28/2014  . Anxiety 09/28/2014    Past Surgical History:  Procedure Laterality Date  . WISDOM TOOTH EXTRACTION      Prior to Admission medications   Medication Sig Start Date End Date Taking? Authorizing Provider  aspirin-acetaminophen-caffeine (EXCEDRIN MIGRAINE) [phone] MG tablet Take 1 tablet by mouth every 6 (six) hours as needed for headache. Patient not taking: Reported on 12/18/2017 11/21/17   Cuthriell, Delorise Royals, PA-C  divalproex (DEPAKOTE ER) 250 MG 24 hr tablet Take 3 tablets (750 mg total) by mouth at bedtime. 06/01/17    Jomarie Longs, MD  traZODone (DESYREL) 50 MG tablet Take 0.5-1 tablets (25-50 mg total) by mouth at bedtime as needed for sleep. 07/02/17   Jomarie Longs, MD    Allergies Kiwi extract  Family History  Problem Relation Age of Onset  . Autoimmune disease Mother   . Anxiety disorder Mother   . Depression Mother   . Osteoarthritis Father   . Depression Father   . Bipolar disorder Maternal Aunt     Social History Social History   Tobacco Use  . Smoking status: Never Smoker  . Smokeless tobacco: Never Used  Substance Use Topics  . Alcohol use: Yes    Comment: rarely  . Drug use: No    Review of Systems  Constitutional: No fever. Eyes: No redness. ENT: No sore throat. Cardiovascular: Denies chest pain. Respiratory: Denies shortness of breath. Gastrointestinal: No vomiting.  Genitourinary: Negative for dysuria.  Musculoskeletal: Negative for back pain. Skin: Negative for rash. Neurological: Negative for headache.   ____________________________________________   PHYSICAL EXAM:  VITAL SIGNS: ED Triage Vitals  Enc Vitals Group     BP 02/18/18 1824 126/83     Pulse Rate 02/18/18 1824 97     Resp 02/18/18 1824 20     Temp 02/18/18 1824 98.2 F (36.8 C)     Temp Source 02/18/18 1824 Oral     SpO2 02/18/18 1824 99 %     Weight 02/18/18 1825 182 lb (82.6 kg)     Height 02/18/18 1825 5\' 7"  (1.702 m)  Head Circumference --      Peak Flow --      Pain Score 02/18/18 1830 0     Pain Loc --      Pain Edu? --      Excl. in GC? --     Constitutional: Alert and oriented. Well appearing and in no acute distress. Eyes: Conjunctivae are normal.  Head: Atraumatic. Nose: No congestion/rhinnorhea. Mouth/Throat: Mucous membranes are moist.   Neck: Normal range of motion.  Cardiovascular:  Good peripheral circulation. Respiratory: Normal respiratory effort.  Gastrointestinal:  No distention.  Musculoskeletal:  Extremities warm and well perfused.  Neurologic:   Normal speech and language. No gross focal neurologic deficits are appreciated.  Skin:  Skin is warm and dry. No rash noted. Psychiatric: Mood and affect are normal. Speech and behavior are normal.  ____________________________________________   LABS (all labs ordered are listed, but only abnormal results are displayed)  Labs Reviewed  ACETAMINOPHEN LEVEL - Abnormal; Notable for the following components:      Result Value   Acetaminophen (Tylenol), Serum <10 (*)    All other components within normal limits  COMPREHENSIVE METABOLIC PANEL  ETHANOL  SALICYLATE LEVEL  CBC  URINE DRUG SCREEN, QUALITATIVE ([facility] ONLY)  POC URINE PREG, ED  POCT PREGNANCY, URINE   ____________________________________________  EKG   ____________________________________________  RADIOLOGY    ____________________________________________   PROCEDURES  Procedure(s) performed: No  Procedures  Critical Care performed: No ____________________________________________   INITIAL IMPRESSION / ASSESSMENT AND PLAN / ED COURSE  Pertinent labs & imaging results that were available during my care of the patient were reviewed by me and considered in my medical decision making (see chart for details).  22 year old female with PMH as noted above presents with an episode of hallucinations and confusion which she describes as a possible "psychotic break."  The symptoms have completely resolved.  She denies any SI or HI, and denies medical symptoms.  On exam, the vital signs are normal.  The patient is comfortable and well-appearing.  The remainder of the exam is unremarkable.  At this time, the patient does not appear acutely psychotic and there is no evidence of danger to self or others.  There does not appear to be any likely organic cause for her symptoms.  The episode is consistent with a psychiatric etiology.  [facility] and reassess.  Disposition will be per psychiatry  recommendations although I expect that the patient will likely be discharged home.  She states that she has outpatient follow-up arranged, in order to get back on her medications.  ----------------------------------------- 11:44 PM on 02/18/2018 -----------------------------------------  I am signing the patient out to the oncoming physician Dr. Dolores FrameSung.  She is pending SOC evaluation.  [facility]. ____________________________________________   FINAL CLINICAL IMPRESSION(S) / ED DIAGNOSES  Final diagnoses:  Hallucinations      NEW MEDICATIONS STARTED DURING THIS VISIT:  New Prescriptions   No medications on file     Note:  This document was prepared using Dragon voice recognition software and may include unintentional dictation errors.   Dionne BucySiadecki, Virgle Arth, MD 02/18/18 [phone]

## 2018-02-18 NOTE — ED Provider Notes (Addendum)
[facility]    CSN: [id number] Arrival date & time: 02/18/18  1700  History   Chief Complaint Chief Complaint  Patient presents with  . Hallucinations    HPI  22 year old female presents urgently for evaluation regarding hallucinations and behavioral issues.  Patient is accompanied by her best friend who is the primary historian at this point in time.  Level 5 caveat for psychiatric illness.  Unreliable history given current state.  Patient's best friend states that she received a text message earlier today asking if she was "real".  She subsequently went to her place of work and she was brought directly here for evaluation.  Friend believes that she has been experiencing hallucinations.  She has been talking about having spiders all over her.  Also reporting auditory hallucinations.  Patient is currently in the room laughing hysterically and repeating the same phrase that "nothing is real".  Upon repeat evaluation/history, patient was more lucid.  She states that she has been off of her risperidone due to recent pregnancy.  She had an abortion a few weeks ago.  She states that she remains on her Depakote.  Patient states that she is "not crazy".  When asked about suicidal ideation she denies but states that she has been cutting herself on her right thigh.  She is followed by psychiatry but has not been seen since April.  Past Medical History:  Diagnosis Date  . Anxiety   . Bipolar 1 disorder, depressed, full remission (HCC)   . Depression    Patient Active Problem List   Diagnosis Date Noted  . Supervision of high risk pregnancy, antepartum 12/31/2017  . Bipolar disease during pregnancy (HCC) 12/31/2017  . Obesity affecting pregnancy 12/31/2017  . BMI 30.0-30.9,adult 12/31/2017  . Irregular bleeding 09/28/2014  . Anxiety 09/28/2014   Past Surgical History:  Procedure Laterality Date  . WISDOM TOOTH EXTRACTION      OB History    Gravida  1   Para      Term        Preterm      AB      Living        SAB      TAB      Ectopic      Multiple      Live Births               Home Medications    Prior to Admission medications   Medication Sig Start Date End Date Taking? Authorizing Provider  divalproex (DEPAKOTE ER) 250 MG 24 hr tablet Take 3 tablets (750 mg total) by mouth at bedtime. 06/01/17  Yes Jomarie LongsEappen, Saramma, MD  traZODone (DESYREL) 50 MG tablet Take 0.5-1 tablets (25-50 mg total) by mouth at bedtime as needed for sleep. 07/02/17  Yes Jomarie LongsEappen, Saramma, MD  aspirin-acetaminophen-caffeine (EXCEDRIN MIGRAINE) [phone] MG tablet Take 1 tablet by mouth every 6 (six) hours as needed for headache. Patient not taking: Reported on 12/18/2017 11/21/17   Cuthriell, Delorise RoyalsJonathan D, PA-C    Family History Family History  Problem Relation Age of Onset  . Autoimmune disease Mother   . Anxiety disorder Mother   . Depression Mother   . Osteoarthritis Father   . Depression Father   . Bipolar disorder Maternal Aunt     Social History Social History   Tobacco Use  . Smoking status: Never Smoker  . Smokeless tobacco: Never Used  Substance Use Topics  . Alcohol use: Yes    Comment:  rarely  . Drug use: No     Allergies   Kiwi extract   Review of Systems Review of Systems  Unable to perform ROS: Psychiatric disorder   Physical Exam Triage Vital Signs ED Triage Vitals [02/18/18 1702]  Enc Vitals Group     BP (!) 147/91     Pulse Rate (!) 135     Resp (!) 24     Temp 98.7 F (37.1 C)     Temp Source Oral     SpO2 100 %     Weight      Height      Head Circumference      Peak Flow      Pain Score      Pain Loc      Pain Edu?      Excl. in GC?     Updated Vital Signs BP (!) 147/91 (BP Location: Left Arm)   Pulse (!) 135   Temp 98.7 F (37.1 C) (Oral)   Resp (!) 24   SpO2 100%   Breastfeeding? Unknown Comment: patient had a recent abortion  Visual Acuity Right Eye Distance:   Left Eye Distance:   Bilateral  Distance:    Right Eye Near:   Left Eye Near:    Bilateral Near:     Physical Exam  Constitutional: She appears well-developed.  Appears in distress secondary to psychiatric illness.  Patient laughing hysterically.  Rocking back and forth.  Pulling at hair.  HENT:  Nose: Nose normal.  Eyes: Conjunctivae are normal. Right eye exhibits no discharge. Left eye exhibits no discharge.  Cardiovascular:  Tachycardic.  Regular rhythm.  Pulmonary/Chest: Effort normal and breath sounds normal. She has no wheezes. She has no rales.  Abdominal: Soft. She exhibits no distension.  Skin:  Superficial cuts noted to the right upper thigh.  Psychiatric:  Rapid speech.  Appears in distress.  Pulling at hair.  Later endorsed hallucinations both auditory and visual.  Nursing note and vitals reviewed.  UC Treatments / Results  Labs (all labs ordered are listed, but only abnormal results are displayed) Labs Reviewed - No data to display  EKG None  Radiology No results found.  Procedures Procedures (including critical care time)  Medications Ordered in UC Medications - No data to display  Initial Impression / Assessment and Plan / UC Course  I have reviewed the triage vital signs and the nursing notes.  Pertinent labs & imaging results that were available during my care of the patient were reviewed by me and considered in my medical decision making (see chart for details).    22 year old female presents with hallucinations and psychiatric issues.  Going via Patent examiner to the hospital.  Final Clinical Impressions(s) / UC Diagnoses   Final diagnoses:  Hallucinations   Discharge Instructions   None    ED Prescriptions    None     Controlled Substance Prescriptions [place]rolled Substance Registry consulted? Not Applicable   Tommie Sams, DO 02/18/18 1721    Everlene Other G, DO 02/18/18 1801

## 2018-02-18 NOTE — ED Triage Notes (Signed)
2 weeks ago pt was having visual hallucinations. Today pt states "I had a breakdown and thought the world was fake, I was crying and laughing".  Pt has been off her medication because pregnant, had abortion but has not been back on medications. Denies SI/HI.  Denies current hallucinations.

## 2018-02-18 NOTE — ED Triage Notes (Signed)
Patient in today with her best friend who states that patient is hallucinating since ~4pm today. Patient does see psychiatry and has appointment on 02/20/18.

## 2018-02-19 NOTE — ED Provider Notes (Signed)
-----------------------------------------   12:11 AM on 02/19/2018 -----------------------------------------  [facility] Dr[facility] Mordecai MaesSanchez who recommends discharge home.  Patient has an appointment with her psychiatrist tomorrow.  Contracts for safety.  No new medication recommendations.  Strict return precautions given.  Patient verbalizes understanding agrees with plan of care.   Irean HongSung, Tanyia Grabbe J, MD 02/19/18 [phone]

## 2018-02-19 NOTE — Discharge Instructions (Addendum)
Continue your medications as directed by your doctor. Return to the ER for feelings of hurting yourself, or other concerns.

## 2018-02-20 ENCOUNTER — Ambulatory Visit: Payer: Self-pay | Admitting: Psychiatry

## 2018-02-27 ENCOUNTER — Ambulatory Visit: Payer: BLUE CROSS/BLUE SHIELD | Admitting: Psychiatry

## 2018-02-27 ENCOUNTER — Other Ambulatory Visit: Payer: Self-pay

## 2018-02-27 ENCOUNTER — Encounter: Payer: Self-pay | Admitting: Psychiatry

## 2018-02-27 VITALS — BP 119/75 | HR 94 | Temp 98.0°F | Wt 178.4 lb

## 2018-02-27 DIAGNOSIS — F509 Eating disorder, unspecified: Secondary | ICD-10-CM

## 2018-02-27 DIAGNOSIS — F410 Panic disorder [episodic paroxysmal anxiety] without agoraphobia: Secondary | ICD-10-CM

## 2018-02-27 DIAGNOSIS — F250 Schizoaffective disorder, bipolar type: Secondary | ICD-10-CM | POA: Diagnosis not present

## 2018-02-27 MED ORDER — RISPERIDONE 0.5 MG PO TABS: 0.5000 mg | ORAL_TABLET | Freq: Every day | ORAL | 0 refills | Status: DC

## 2018-02-27 MED ORDER — TRAZODONE HCL 50 MG PO TABS: 25.0000 mg | ORAL_TABLET | Freq: Every evening | ORAL | 1 refills | Status: DC | PRN

## 2018-02-27 MED ORDER — DIVALPROEX SODIUM ER 250 MG PO TB24: 750.0000 mg | ORAL_TABLET | Freq: Every day | ORAL | 0 refills | Status: DC
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
## 2018-03-12 ENCOUNTER — Ambulatory Visit: Payer: BLUE CROSS/BLUE SHIELD | Admitting: Psychiatry

## 2018-03-19 ENCOUNTER — Ambulatory Visit: Payer: BLUE CROSS/BLUE SHIELD | Admitting: Licensed Clinical Social Worker

## 2018-03-22 ENCOUNTER — Other Ambulatory Visit: Payer: Self-pay | Admitting: Psychiatry

## 2018-03-22 DIAGNOSIS — F250 Schizoaffective disorder, bipolar type: Secondary | ICD-10-CM

## 2018-03-26 ENCOUNTER — Ambulatory Visit: Payer: BLUE CROSS/BLUE SHIELD | Admitting: Licensed Clinical Social Worker

## 2018-03-26 DIAGNOSIS — F250 Schizoaffective disorder, bipolar type: Secondary | ICD-10-CM | POA: Diagnosis not present
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
## 2018-04-03 ENCOUNTER — Ambulatory Visit: Payer: BLUE CROSS/BLUE SHIELD | Admitting: Psychiatry

## 2018-04-03 ENCOUNTER — Encounter: Payer: Self-pay | Admitting: Psychiatry

## 2018-04-03 ENCOUNTER — Other Ambulatory Visit: Payer: Self-pay

## 2018-04-03 VITALS — BP 117/78 | HR 85 | Temp 98.3°F | Wt 180.8 lb

## 2018-04-03 DIAGNOSIS — F410 Panic disorder [episodic paroxysmal anxiety] without agoraphobia: Secondary | ICD-10-CM

## 2018-04-03 DIAGNOSIS — F250 Schizoaffective disorder, bipolar type: Secondary | ICD-10-CM | POA: Diagnosis not present

## 2018-04-03 DIAGNOSIS — Z9189 Other specified personal risk factors, not elsewhere classified: Secondary | ICD-10-CM

## 2018-04-03 MED ORDER — DIVALPROEX SODIUM ER 250 MG PO TB24: 750.0000 mg | ORAL_TABLET | Freq: Every day | ORAL | 1 refills | Status: DC

## 2018-04-03 MED ORDER — RISPERIDONE 1 MG PO TABS: 1.0000 mg | ORAL_TABLET | Freq: Every day | ORAL | 1 refills | Status: DC
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
## 2018-04-09 ENCOUNTER — Ambulatory Visit: Payer: BLUE CROSS/BLUE SHIELD | Admitting: Licensed Clinical Social Worker

## 2018-04-09 DIAGNOSIS — F250 Schizoaffective disorder, bipolar type: Secondary | ICD-10-CM

## 2018-04-17 ENCOUNTER — Ambulatory Visit: Payer: BLUE CROSS/BLUE SHIELD | Admitting: Psychiatry

## 2018-04-23 ENCOUNTER — Ambulatory Visit: Payer: BLUE CROSS/BLUE SHIELD | Admitting: Licensed Clinical Social Worker

## 2018-04-23 ENCOUNTER — Other Ambulatory Visit: Payer: Self-pay | Admitting: Psychiatry

## 2018-04-23 DIAGNOSIS — F250 Schizoaffective disorder, bipolar type: Secondary | ICD-10-CM | POA: Diagnosis not present

## 2018-04-23 DIAGNOSIS — F410 Panic disorder [episodic paroxysmal anxiety] without agoraphobia: Secondary | ICD-10-CM

## 2018-05-01 ENCOUNTER — Other Ambulatory Visit: Payer: Self-pay | Admitting: Psychiatry

## 2018-05-01 DIAGNOSIS — F250 Schizoaffective disorder, bipolar type: Secondary | ICD-10-CM

## 2018-05-02 ENCOUNTER — Other Ambulatory Visit: Payer: Self-pay | Admitting: Psychiatry

## 2018-05-02 DIAGNOSIS — F250 Schizoaffective disorder, bipolar type: Secondary | ICD-10-CM

## 2018-05-13 ENCOUNTER — Ambulatory Visit: Payer: BLUE CROSS/BLUE SHIELD | Admitting: Licensed Clinical Social Worker

## 2018-05-29 NOTE — Progress Notes (Signed)
   THERAPIST PROGRESS NOTE  Session Time: 58 min  Participation Level: Active  Behavioral Response: CasualAlertEuthymic  Type of Therapy: Individual Therapy  Treatment Goals addressed: Coping  Interventions: CBT and Motivational Interviewing  Summary: Olivia Thomas is a 23 y.o. female who presents with continued symptoms of her diagnosis.  Therapist met with Patient in an initial therapy session to assess current mood and to build rapport. Therapist engaged Patient in discussion about her life and what is going well for her. Therapist provided support for Patient as she shared details about her life, her current stressors, mood, coping skills and her past. Therapist prompted Patient to discuss her support system and ways that she manages her daily stress, anger, and frustrations.   LCSW discussed what psychotherapy is and is not and the importance of the therapeutic relationship to include open and honest communication between client and therapist and building trust.  Reviewed advantages and disadvantages of the therapeutic process and limitations to the therapeutic relationship including LCSW's role in maintaining the safety of the client, others and those in client's care.    Suicidal/Homicidal: No  Plan: Return again in 2  weeks.  Diagnosis: Axis I: Schizoaffective Disorder    Axis II: No diagnosis    Lubertha South, LCSW 04/09/2018

## 2018-06-09 ENCOUNTER — Other Ambulatory Visit: Payer: Self-pay | Admitting: Psychiatry

## 2018-06-09 DIAGNOSIS — F250 Schizoaffective disorder, bipolar type: Secondary | ICD-10-CM

## 2018-06-24 ENCOUNTER — Other Ambulatory Visit: Payer: Self-pay | Admitting: Psychiatry

## 2018-06-24 DIAGNOSIS — F250 Schizoaffective disorder, bipolar type: Secondary | ICD-10-CM

## 2018-07-21 NOTE — Progress Notes (Signed)
   THERAPIST PROGRESS NOTE  Session Time: 55 min  Participation Level: Active  Behavioral Response: CasualAlertAnxious  Type of Therapy: Individual Therapy  Treatment Goals addressed: Anxiety  Interventions: CBT and Motivational Interviewing  Summary: Olivia Thomas is a 23 y.o. female who presents with continued symptoms.  Discussed present stressors and coping strategies taht have been useful.  Discussion on coping skills that have yielded no results and discussed ways to alter to improve results.    Suicidal/Homicidal: No  Plan: Return again in 2 weeks.  Diagnosis: Axis I: Schizoaffective Disorder    Axis II: No diagnosis    Marinda Elk, LCSW 2/11//2020

## 2018-07-23 ENCOUNTER — Telehealth: Payer: Self-pay | Admitting: Family Medicine

## 2018-07-23 NOTE — Telephone Encounter (Signed)
Please advise 

## 2018-07-23 NOTE — Telephone Encounter (Signed)
Schedule her for an e-visit tomorrow so we can get back story and determine if she will need to be tested.

## 2018-07-23 NOTE — Telephone Encounter (Signed)
Pt's husband was exposed to someone who tested positive for Covid and had to be tested.  She said her employer states she has to be tested also to return to work  Please advise  [phone]  Thanks Barth Kirks

## 2018-07-24 NOTE — Telephone Encounter (Signed)
Apt made for 07/25/2018 at 10:40AM

## 2018-07-25 ENCOUNTER — Other Ambulatory Visit: Payer: Self-pay

## 2018-07-25 ENCOUNTER — Telehealth: Payer: Self-pay | Admitting: *Deleted

## 2018-07-25 ENCOUNTER — Ambulatory Visit (INDEPENDENT_AMBULATORY_CARE_PROVIDER_SITE_OTHER): Payer: BLUE CROSS/BLUE SHIELD | Admitting: Physician Assistant

## 2018-07-25 DIAGNOSIS — Z20822 Contact with and (suspected) exposure to covid-19: Secondary | ICD-10-CM

## 2018-07-25 DIAGNOSIS — Z20828 Contact with and (suspected) exposure to other viral communicable diseases: Secondary | ICD-10-CM | POA: Diagnosis not present

## 2018-07-25 DIAGNOSIS — R6889 Other general symptoms and signs: Secondary | ICD-10-CM | POA: Diagnosis not present

## 2018-07-25 LAB — NOVEL CORONAVIRUS, NAA: SARS-CoV-2, NAA: NOT DETECTED

## 2018-07-25 NOTE — Progress Notes (Signed)
Patient: Olivia Thomas Female    DOB: 1995/04/26   23 y.o.   MRN: [id number] Visit Date: 07/26/2018  Today's Provider: Trey Sailors, PA-C   Chief Complaint  Patient presents with  . corona exposure   Subjective:   No vitals obtained during this visit.  Virtual Visit via Video Note  I connected with [name] A Capuano on 07/25/18 at 10:40 AM EDT by a video enabled telemedicine application and verified that I am speaking with the correct person using two identifiers.   I discussed the limitations of evaluation and management by telemedicine and the availability of in person appointments. The patient expressed understanding and agreed to proceed.  Patient location: home Provider location: [facility]/home office  Persons involved in the visit: patient, provider    HPI Patient is requesting a referral for COVID-19 virus testing. Patient stated that her husband exposed to positive cases of the virus on Tuesday 07/23/2018. He is currently being tested and his results are pending. He is not currently having symptoms. Her employer wanted her to contact her PCP to get the testing to done before so she could get cleared to come back to work. Patient states she is not having any symptoms at this time. No SOB, fever, chest pain, nausea, vomiting, diarrhea. 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    Allergies  Allergen Reactions  . Kiwi Extract Swelling    oral     Current Outpatient Medications:  .  aspirin-acetaminophen-caffeine (EXCEDRIN MIGRAINE) 250-250-65 MG tablet, Take 1 tablet by mouth every 6 (six) hours as needed for headache., Disp: 30 tablet, Rfl: 1 .  divalproex (DEPAKOTE ER) 250 MG 24 hr tablet, Take 3 tablets (750 mg total) by mouth at bedtime., Disp: 90 tablet, Rfl: 1 .  risperiDONE (RISPERDAL) 1 MG tablet, Take 1 tablet (1 mg total) by mouth at bedtime., Disp: 30 tablet, Rfl: 1 .  SPRINTEC 28 0.25-35 MG-MCG tablet, Take 1 tablet by mouth  daily., Disp: , Rfl: 0 .  traZODone (DESYREL) 50 MG tablet, Take 0.5-1 tablets (25-50 mg total) by mouth at bedtime as needed for sleep., Disp: 90 tablet, Rfl: 1  Review of Systems  Constitutional: Negative.   Respiratory: Negative.   Genitourinary: Negative.   Hematological: Negative.     Social History   Tobacco Use  . Smoking status: Never Smoker  . Smokeless tobacco: Never Used  Substance Use Topics  . Alcohol use: Yes    Comment: rarely      Objective:   There were no vitals taken for this visit. There were no vitals filed for this visit.   Physical Exam Constitutional:      Appearance: Normal appearance.  Pulmonary:     Effort: Pulmonary effort is normal. No respiratory distress.  Neurological:     Mental Status: She is alert.  Psychiatric:        Mood and Affect: Mood normal.        Behavior: Behavior normal.         Assessment & Plan    1. Exposure to 2019 Novel Coronavirus  Patient exposed to coronavirus, works in [employer]. Requested testing from [employer], awaiting confirmation for order. If order not approved, will write letter explaining details of quarantine.   The entirety of the information documented in the History of Present Illness, Review of Systems and Physical Exam were personally obtained by me. Portions of this information were initially documented by Cheron Every, CMA  and reviewed by me for thoroughness and accuracy.       Trey SailorsAdriana M Pollak, PA-C  [facility]

## 2018-07-25 NOTE — Telephone Encounter (Signed)
Covid -19 testing scheduled for today at 12:30. Patient aware to wear face mask and stay in car.

## 2018-07-26 ENCOUNTER — Telehealth: Payer: Self-pay | Admitting: *Deleted

## 2018-07-26 NOTE — Telephone Encounter (Signed)
Sent letter through MyChart.

## 2018-07-26 NOTE — Telephone Encounter (Signed)
Patient called office requesting a letter for her employer, stating she was advised to remain in quarantine. Please advise?

## 2018-07-27 ENCOUNTER — Other Ambulatory Visit: Payer: Self-pay | Admitting: Psychiatry

## 2018-07-27 DIAGNOSIS — F250 Schizoaffective disorder, bipolar type: Secondary | ICD-10-CM

## 2018-07-29 LAB — NOVEL CORONAVIRUS, NAA: SARS-CoV-2, NAA: NOT DETECTED

## 2018-08-22 DIAGNOSIS — Z20828 Contact with and (suspected) exposure to other viral communicable diseases: Secondary | ICD-10-CM | POA: Diagnosis not present

## 2018-08-24 ENCOUNTER — Other Ambulatory Visit: Payer: Self-pay | Admitting: Psychiatry

## 2018-10-05 ENCOUNTER — Other Ambulatory Visit: Payer: Self-pay

## 2018-10-05 DIAGNOSIS — M549 Dorsalgia, unspecified: Secondary | ICD-10-CM | POA: Diagnosis not present

## 2018-10-05 DIAGNOSIS — Z5321 Procedure and treatment not carried out due to patient leaving prior to being seen by health care provider: Secondary | ICD-10-CM | POA: Insufficient documentation

## 2018-10-05 NOTE — ED Triage Notes (Signed)
Patient c/o lower/medial back pain that woke her from sleep tonight. Patient denies injury. Patient is a Educational psychologist, she worked today.

## 2018-10-06 ENCOUNTER — Emergency Department
Admission: EM | Admit: 2018-10-06 | Discharge: 2018-10-06 | Disposition: A | Discharge status: Home / Self Care | Payer: BC Managed Care – PPO | Attending: Emergency Medicine | Admitting: Emergency Medicine

## 2018-10-11 ENCOUNTER — Ambulatory Visit: Payer: Self-pay

## 2018-10-11 ENCOUNTER — Other Ambulatory Visit: Payer: Self-pay

## 2018-10-11 VITALS — BP 132/78 | HR 109 | Temp 97.5°F | Resp 26

## 2018-10-11 DIAGNOSIS — F419 Anxiety disorder, unspecified: Secondary | ICD-10-CM | POA: Diagnosis not present

## 2018-10-11 DIAGNOSIS — R079 Chest pain, unspecified: Secondary | ICD-10-CM | POA: Diagnosis not present

## 2018-10-11 DIAGNOSIS — R071 Chest pain on breathing: Secondary | ICD-10-CM

## 2018-10-11 DIAGNOSIS — Z5321 Procedure and treatment not carried out due to patient leaving prior to being seen by health care provider: Secondary | ICD-10-CM | POA: Diagnosis not present

## 2018-10-11 MED ORDER — ASPIRIN 325 MG PO TABS
650.0000 mg | ORAL_TABLET | Freq: Once | ORAL | Status: AC
  Administered 2018-10-11: 650 mg via ORAL

## 2018-10-11 NOTE — Progress Notes (Signed)
   Subjective:    Patient ID: Olivia Thomas, female    DOB: Apr 03, 1995, 23 y.o.   MRN: [id number]  Patient presented to clinic with complaints of chest pain 5/10 that is described as stabbing in nature. Patient flushed with labored breathing. Patient guided to exam table, BP checked, pulse ox in place at this time.   Chest Pain  This is a new problem. The current episode started today. The onset quality is sudden. The pain is at a severity of 5/10. The pain is moderate. The quality of the pain is described as sharp. The pain does not radiate. Associated symptoms include dizziness.  Her past medical history is significant for anxiety/panic attacks.      Review of Systems  Cardiovascular: Positive for chest pain.  Neurological: Positive for dizziness.       Objective:   Physical Exam  Assessment & Plan:   Wt Readings from Last 3 Encounters:  10/05/18 180 lb (81.6 kg)  02/18/18 182 lb (82.6 kg)  01/14/18 192 lb (87.1 kg)   Temp Readings from Last 3 Encounters:  10/11/18 (!) 97.5 F (36.4 C) (Oral)  10/05/18 98 F (36.7 C)  02/19/18 98.2 F (36.8 C) (Oral)   BP Readings from Last 3 Encounters:  10/11/18 130/90  10/05/18 120/83  02/19/18 140/82   Pulse Readings from Last 3 Encounters:  10/11/18 (!) 114  10/05/18 99  02/19/18 98     No results found for: POCGLU         Thank you!!  [place] [facility] [place]: [phone]  Cell:  [phone] Website: [email or web address]

## 2018-10-28 ENCOUNTER — Telehealth: Payer: Self-pay

## 2018-10-28 ENCOUNTER — Other Ambulatory Visit: Payer: Self-pay

## 2018-10-28 DIAGNOSIS — Z20822 Contact with and (suspected) exposure to covid-19: Secondary | ICD-10-CM

## 2018-10-28 NOTE — Telephone Encounter (Signed)
Received call from patient with concerns related to COVIVD-19. Patient states on 08/16 she was exhibiting chills. She is currently exhibiting sore throat, diarrhea, weakness, shortness of breath on exertion.

## 2018-10-29 LAB — NOVEL CORONAVIRUS, NAA: SARS-CoV-2, NAA: NOT DETECTED

## 2018-10-30 ENCOUNTER — Telehealth: Payer: Self-pay | Admitting: Physician Assistant

## 2018-10-30 ENCOUNTER — Telehealth: Payer: Self-pay

## 2018-10-30 ENCOUNTER — Other Ambulatory Visit: Payer: Self-pay

## 2018-10-30 ENCOUNTER — Ambulatory Visit: Payer: BC Managed Care – PPO | Admitting: Physician Assistant

## 2018-10-30 VITALS — BP 138/94 | HR 113 | Temp 97.3°F | Wt 179.0 lb

## 2018-10-30 DIAGNOSIS — J029 Acute pharyngitis, unspecified: Secondary | ICD-10-CM | POA: Diagnosis not present

## 2018-10-30 DIAGNOSIS — F259 Schizoaffective disorder, unspecified: Secondary | ICD-10-CM

## 2018-10-30 LAB — POCT RAPID STREP A (OFFICE): Rapid Strep A Screen: NEGATIVE

## 2018-10-30 NOTE — Telephone Encounter (Signed)
OK to schedule in office visit for strep swab.

## 2018-10-30 NOTE — Telephone Encounter (Signed)
Pt's appt was scheduled for this afternoon.

## 2018-10-30 NOTE — Telephone Encounter (Signed)
Placed call to patient to see if she is still exhibiting symptoms and patient states she continues to have sore throat and thinks it may be strep due to her COVID-19 test being negative. Advised she needed to follow up with her PCP for work release because of continued symptoms.

## 2018-10-30 NOTE — Telephone Encounter (Signed)
Please advise 

## 2018-10-30 NOTE — Progress Notes (Signed)
Patient: Olivia Thomas Female    DOB: 29-Jul-1995   23 y.o.   MRN: [id number] Visit Date: 10/31/2018  Today's Provider: Trinna Post, PA-C   Chief Complaint  Patient presents with  . URI   Subjective:     HPI   Patient here today c/o head ache, sore throat, fatigue, diarrhea and nausea x's 3-4 days. Patient denies any fever or any contact with COVID19. Patient reports taking Ibuprofen for sore throat, reports good symptom control. Patient received her COVID 19 test result yesterday and it was negative. Patient has history of sore throats and strep throats in childhood, nearly got her tonsils removed. Denies fevers, chills. Reports she has had mono when she was in high school.    Allergies  Allergen Reactions  . Kiwi Extract Swelling    oral     Current Outpatient Medications:  .  SPRINTEC 28 0.25-35 MG-MCG tablet, Take 1 tablet by mouth daily., Disp: , Rfl: 0 .  traZODone (DESYREL) 50 MG tablet, Take 0.5-1 tablets (25-50 mg total) by mouth at bedtime as needed for sleep., Disp: 90 tablet, Rfl: 1 .  divalproex (DEPAKOTE ER) 250 MG 24 hr tablet, Take 3 tablets (750 mg total) by mouth at bedtime. (Patient not taking: Reported on 10/30/2018), Disp: 90 tablet, Rfl: 1 .  risperiDONE (RISPERDAL) 1 MG tablet, Take 1 tablet (1 mg total) by mouth at bedtime. (Patient not taking: Reported on 10/30/2018), Disp: 30 tablet, Rfl: 1  Review of Systems  Constitutional: Positive for activity change and fatigue.  HENT: Positive for sore throat. Negative for ear pain.   Gastrointestinal: Positive for diarrhea and nausea.  Neurological: Positive for headaches.    Social History   Tobacco Use  . Smoking status: Never Smoker  . Smokeless tobacco: Never Used  Substance Use Topics  . Alcohol use: Yes    Comment: rarely      Objective:   BP (!) 138/94 (BP Location: Left Arm, Patient Position: Sitting, Cuff Size: Normal)   Pulse (!) 113   Temp (!) 97.3 F (36.3 C) (Temporal)    Wt 179 lb (81.2 kg)   BMI 28.46 kg/m  Vitals:   10/30/18 1348  BP: (!) 138/94  Pulse: (!) 113  Temp: (!) 97.3 F (36.3 C)  TempSrc: Temporal  Weight: 179 lb (81.2 kg)     Physical Exam Constitutional:      Appearance: Normal appearance. She is not ill-appearing.  HENT:     Mouth/Throat:     Pharynx: Oropharynx is clear. Uvula midline. No oropharyngeal exudate or posterior oropharyngeal erythema.     Comments: Tonsils are large but not edematous or erythematous.  Neck:     Musculoskeletal: Neck supple.  Cardiovascular:     Rate and Rhythm: Normal rate and regular rhythm.     Heart sounds: Normal heart sounds.  Pulmonary:     Effort: Pulmonary effort is normal.     Breath sounds: Normal breath sounds.  Lymphadenopathy:     Cervical: No cervical adenopathy.  Skin:    General: Skin is warm and dry.  Neurological:     Mental Status: She is alert and oriented to person, place, and time. Mental status is at baseline.  Psychiatric:        Mood and Affect: Mood normal.        Behavior: Behavior normal.      Results for orders placed or performed in visit on 10/30/18  POCT rapid strep  A  Result Value Ref Range   Rapid Strep A Screen Negative Negative       Assessment & Plan    1. Sore throat  Strep is negative. Work note provided out of abundance of caution.   - POCT rapid strep A  The entirety of the information documented in the History of Present Illness, Review of Systems and Physical Exam were personally obtained by me. Portions of this information were initially documented by Rondel BatonSulibeya Dimas, CMA and reviewed by me for thoroughness and accuracy.        Trey SailorsAdriana M Pollak, PA-C  [facility]

## 2018-10-30 NOTE — Telephone Encounter (Signed)
Patient advised.

## 2018-10-30 NOTE — Telephone Encounter (Signed)
Pt's Psychiatrist  Dr. Shea Evans has released her back in Feb of 2020.  Pt is requesting another referral for a psychiatrist.  Thanks, [facility]

## 2018-10-30 NOTE — Telephone Encounter (Signed)
Pt called saying she just got a neg covid test result but has a sore throat and her employer says she has to be tested for strep.  Please advise [phone]  Thanks Con Memos

## 2018-10-30 NOTE — Telephone Encounter (Signed)
Placed referral. She is going to need to attend her visits with new psychiatrist or she will face similar dismissal. If she needs to re-establish her medications sooner, I would recommend seeing [facility] or [facility]. Those are walk in and do not require referrals.

## 2018-11-05 ENCOUNTER — Telehealth: Payer: BC Managed Care – PPO | Admitting: Nurse Practitioner

## 2018-11-05 DIAGNOSIS — H1033 Unspecified acute conjunctivitis, bilateral: Secondary | ICD-10-CM | POA: Diagnosis not present

## 2018-11-05 MED ORDER — POLYMYXIN B-TRIMETHOPRIM 10000-0.1 UNIT/ML-% OP SOLN: 2.0000 [drp] | OPHTHALMIC | 0 refills | Status: DC

## 2018-11-05 NOTE — Progress Notes (Signed)
We are sorry that you are not feeling well.  Here is how we plan to help!  Based on what you have shared with me it looks like you have conjunctivitis.  Conjunctivitis is a common inflammatory or infectious condition of the eye that is often referred to as "pink eye".  In most cases it is contagious (viral or bacterial). However, not all conjunctivitis requires antibiotics (ex. Allergic).  We have made appropriate suggestions for you based upon your presentation.  I have prescribed Polytrim Ophthalmic drops 1-2 drops 4 times a day times 5 days  Pink eye can be highly contagious.  It is typically spread through direct contact with secretions, or contaminated objects or surfaces that one may have touched.  Strict handwashing is suggested with soap and water is urged.  If not available, use alcohol based had sanitizer.  Avoid unnecessary touching of the eye.  If you wear contact lenses, you will need to refrain from wearing them until you see no white discharge from the eye for at least 24 hours after being on medication.  You should see symptom improvement in 1-2 days after starting the medication regimen.  Call us if symptoms are not improved in 1-2 days.  Home Care:  Wash your hands often!  Do not wear your contacts until you complete your treatment plan.  Avoid sharing towels, bed linen, personal items with a person who has pink eye.  See attention for anyone in your home with similar symptoms.  Get Help Right Away If:  Your symptoms do not improve.  You develop blurred or loss of vision.  Your symptoms worsen (increased discharge, pain or redness)  Your e-visit answers were reviewed by a board certified advanced clinical practitioner to complete your personal care plan.  Depending on the condition, your plan could have included both over the counter or prescription medications.  If there is a problem please reply  once you have received a response from your provider.  Your safety is  important to us.  If you have drug allergies check your prescription carefully.    You can use MyChart to ask questions about today's visit, request a non-urgent call back, or ask for a work or school excuse for 24 hours related to this e-Visit. If it has been greater than 24 hours you will need to follow up with your provider, or enter a new e-Visit to address those concerns.   You will get an e-mail in the next two days asking about your experience.  I hope that your e-visit has been valuable and will speed your recovery. Thank you for using e-visits.   5-10 minutes spent reviewing and documenting in chart.   

## 2018-11-09 ENCOUNTER — Other Ambulatory Visit: Payer: Self-pay

## 2018-11-09 ENCOUNTER — Ambulatory Visit
Admission: EM | Admit: 2018-11-09 | Discharge: 2018-11-09 | Disposition: A | Discharge status: Home / Self Care | Payer: BC Managed Care – PPO | Attending: Urgent Care | Admitting: Urgent Care

## 2018-11-09 ENCOUNTER — Encounter: Payer: Self-pay | Admitting: Emergency Medicine

## 2018-11-09 DIAGNOSIS — H1033 Unspecified acute conjunctivitis, bilateral: Secondary | ICD-10-CM

## 2018-11-09 MED ORDER — ERYTHROMYCIN 5 MG/GM OP OINT: TOPICAL_OINTMENT | OPHTHALMIC | 0 refills | Status: DC

## 2018-11-09 NOTE — ED Triage Notes (Signed)
Patient c/o redness, tenderness, and drainage from both eyes that started on Monday.  Patient states that she did an E-Visit on Tuesday.  Patient states that she has been on prescribed eye drops that have not gotten better.

## 2018-11-09 NOTE — Discharge Instructions (Signed)
It was very nice seeing you today in clinic. Thank you for entrusting me with your care.   Avoid touching/rubbing eyes. Cool compresses will help soothe eyes. Please utilize the medications that we discussed. Your prescriptions have been called in to your pharmacy.   Make arrangements to follow up with your eye doctor in 2-3 days for re-evaluation if not improving. If your symptoms/condition worsens, please seek follow up care either here or in the ER. Please remember, our [place] providers are "right here with you" when you need [place].   Again, it was my pleasure to take care of you today. Thank you for choosing our clinic. I hope that you start to feel better quickly.   Honor Loh, MSN, APRN, FNP-C, CEN Advanced Practice Provider [facility]

## 2018-11-09 NOTE — ED Provider Notes (Signed)
[place], [place]   Name: Olivia Thomas DOB: 09-Aug-1995 MRN: [id number] CSN: [id number] PCP: Trey Sailors, PA-C  Arrival date and time:  11/09/18 1346  Chief Complaint:  Eye Problem (bilateral)   NOTE: Prior to seeing the patient today, I have reviewed the triage nursing documentation and vital signs. Clinical staff has updated patient's PMH/PSHx, current medication list, and drug allergies/intolerances to ensure comprehensive history available to assist in medical decision making.   History:   HPI: ALDONA SONNEK is a 23 y.o. female who presents today with complaints of pain and redness in her  BILATERAL eyes that began on Monday (11/04/2018). She denies any injuries to the eyes. Patient with excessive tearing. She has experienced yellow exudative crusting. Vision is blurred. Patient notes that eye irritation is exacerbated by light (photophobia); she is wearing sunglasses in clinic today. She does not wear contact lenses. Patient was seen via a tele-health visit on 11/05/2018 by Daphine Deutscher, FNP; notes reviewed. Patient felt to have bacterial conjunctivitis. She was prescribed a course of Polytrim ophthalmic gtts and advised to call back if not improving in 1-2 days. Patient states, "they told me I would be better by Saturday. It's Saturday and I feel like I am worse".  Visual Acuity: Right Eye Distance: 20/40 uncorrected Left Eye Distance: 20/50 uncorrected Bilateral Distance: 20/40 uncorrected  Past Medical History:  Diagnosis Date   Anxiety    Bipolar 1 disorder, depressed, full remission (HCC)    Depression     Past Surgical History:  Procedure Laterality Date   WISDOM TOOTH EXTRACTION      Family History  Problem Relation Age of Onset   Autoimmune disease Mother    Anxiety disorder Mother    Depression Mother    Osteoarthritis Father    Depression Father    Bipolar disorder Maternal Aunt     Social History   Tobacco Use   Smoking status: Never Smoker     Smokeless tobacco: Never Used  Substance Use Topics   Alcohol use: Yes    Comment: rarely   Drug use: No    Patient Active Problem List   Diagnosis Date Noted   Supervision of high risk pregnancy, antepartum 12/31/2017   Bipolar disease during pregnancy (HCC) 12/31/2017   Obesity affecting pregnancy 12/31/2017   BMI 30.0-30.9,adult 12/31/2017   Irregular bleeding 09/28/2014   Anxiety 09/28/2014    Home Medications:    Current Meds  Medication Sig   SPRINTEC 28 0.25-35 MG-MCG tablet Take 1 tablet by mouth daily.   traZODone (DESYREL) 50 MG tablet Take 0.5-1 tablets (25-50 mg total) by mouth at bedtime as needed for sleep.   [DISCONTINUED] trimethoprim-polymyxin b (POLYTRIM) ophthalmic solution Place 2 drops into both eyes every 4 (four) hours.    Allergies:   Kiwi extract  Review of Systems (ROS): Review of Systems  Constitutional: Negative for chills and fever.  HENT: Negative for congestion, ear pain, rhinorrhea, sinus pressure, sinus pain and sore throat.   Eyes: Positive for photophobia, pain, discharge, redness, itching and visual disturbance.  Respiratory: Negative for cough and shortness of breath.   Cardiovascular: Negative for chest pain and palpitations.  Gastrointestinal: Negative for nausea and vomiting.  Neurological: Negative for dizziness, syncope, weakness and headaches.  All other systems reviewed and are negative.    Vital Signs: Today's Vitals   11/09/18 1401 11/09/18 1404 11/09/18 1428  BP:  (!) 128/95   Pulse:  (!) 109   Resp:  16   Temp:  99 F (37.2 C)   TempSrc:  Oral   SpO2:  100%   Weight: 178 lb (80.7 kg)    Height: 5\' 6"  (1.676 m)    PainSc: 7   7     Physical Exam: Physical Exam  Constitutional: She is oriented to person, place, and time and well-developed, well-nourished, and in no distress.  HENT:  Head: Normocephalic and atraumatic.  Mouth/Throat: Mucous membranes are normal.  Eyes: Pupils are equal, round,  and reactive to light. EOM are normal. Right eye exhibits discharge and exudate. Left eye exhibits discharge and exudate. Right conjunctiva is injected. Left conjunctiva is injected.  Cardiovascular: Tachycardia present.  Pulmonary/Chest: Effort normal. No respiratory distress.  Neurological: She is alert and oriented to person, place, and time. Gait normal.  Skin: Skin is warm and dry. No rash noted.  Psychiatric: Mood, memory, affect and judgment normal.  Nursing note and vitals reviewed.   Urgent Care Treatments / Results:   LABS: PLEASE NOTE: all labs that were ordered this encounter are listed, however only abnormal results are displayed. Labs Reviewed - No data to display  EKG: -None  RADIOLOGY: No results found.  PROCEDURES: Procedures  MEDICATIONS RECEIVED THIS VISIT: Medications - No data to display  PERTINENT CLINICAL COURSE NOTES/UPDATES:   Initial Impression / Assessment and Plan / Urgent Care Course:  Pertinent labs & imaging results that were available during my care of the patient were personally reviewed by me and considered in my medical decision making (see lab/imaging section of note for values and interpretations).  Ginette PitmanJennifer A Sansoucie is a 23 y.o. female [facility] with complaints of Eye Problem (bilateral)   Patient is well appearing overall in clinic today. She does not appear to be in any acute distress. Presenting symptoms (see HPI) and exam as documented above. Symptoms and physical exam consistent with acute bacterial conjunctivitis.  BILATERAL ophthalmic infection appears to be resistant to prescribed Polytrim drops.  Will discontinue and change treatment to erythromycin ophthalmic ointment to be used for the next 5 days.  Patient encouraged to utilize APAP and/or IBU as needed for discomfort.  Also discussed use of cool compresses to help soothe eyes.  Patient was advised to refrain from touching/rubbing eyes.    Discussed  need for follow-up with ophthalmology in the next 2 to 3 days if not improving. I have reviewed the follow up and strict return precautions for any new or worsening symptoms. Patient is aware of symptoms that would be deemed urgent/emergent, and would thus require further evaluation either here or in the emergency department. At the time of discharge, she verbalized understanding and consent with the discharge plan as it was reviewed with her. All questions were fielded by provider and/or clinic staff prior to patient discharge.    Final Clinical Impressions / Urgent Care Diagnoses:   Final diagnoses:  Acute bacterial conjunctivitis of both eyes    New Prescriptions:  [place] Controlled Substance Registry consulted? Not Applicable  Meds ordered this encounter  Medications   erythromycin ophthalmic ointment    Sig: Place a 1/2 inch ribbon of ointment into the lower eyelid QID x 5 days.    Dispense:  1 g    Refill:  0    Recommended Follow up Care:  Patient encouraged to follow up with the following provider within the specified time frame, or sooner as dictated by the severity of her symptoms. As always, she was instructed that for any urgent/emergent  care needs, she should seek care either here or in the emergency department for more immediate evaluation.  Follow-up Information    Your eye doctor In 2 days.   Why: Need to be seen if not improving.        NOTE: This note was prepared using Lobbyist along with smaller Company secretary. Despite my best ability to proofread, there is the potential that transcriptional errors may still occur from this process, and are completely unintentional.    Karen Kitchens, NP 11/09/18 1700

## 2018-12-16 ENCOUNTER — Other Ambulatory Visit: Payer: Self-pay

## 2018-12-16 ENCOUNTER — Ambulatory Visit: Payer: BC Managed Care – PPO | Admitting: Physician Assistant

## 2018-12-16 ENCOUNTER — Encounter: Payer: Self-pay | Admitting: Physician Assistant

## 2018-12-16 VITALS — BP 130/84 | HR 108 | Temp 97.5°F | Resp 16 | Ht 66.0 in | Wt 178.0 lb

## 2018-12-16 DIAGNOSIS — Z30011 Encounter for initial prescription of contraceptive pills: Secondary | ICD-10-CM

## 2018-12-16 DIAGNOSIS — F209 Schizophrenia, unspecified: Secondary | ICD-10-CM | POA: Diagnosis not present

## 2018-12-16 MED ORDER — NORGESTIMATE-ETH ESTRADIOL 0.25-35 MG-MCG PO TABS: 1.0000 | ORAL_TABLET | Freq: Every day | ORAL | 3 refills | Status: DC

## 2018-12-16 NOTE — Patient Instructions (Signed)

## 2018-12-16 NOTE — Progress Notes (Signed)
       Patient: Olivia Thomas Female    DOB: October 01, 1995   23 y.o.   MRN: [id number] Visit Date: 12/16/2018  Today's Provider: Trinna Post, PA-C   Chief Complaint  Patient presents with  . Contraception   Subjective:     HPI Patient here today for refill on oral birth control pill. Patient does want to talk about other options. History of therapeutic abortion last year. No history of heart attack, blood clot, stroke. History of irregular menses and cysts on ovaries.   Needs new referral for schizphrenia.   Allergies  Allergen Reactions  . Kiwi Extract Swelling    oral     Current Outpatient Medications:  .  SPRINTEC 28 0.25-35 MG-MCG tablet, Take 1 tablet by mouth daily., Disp: , Rfl: 0 .  traZODone (DESYREL) 50 MG tablet, Take 0.5-1 tablets (25-50 mg total) by mouth at bedtime as needed for sleep., Disp: 90 tablet, Rfl: 1  Review of Systems  Constitutional: Negative.   Respiratory: Negative.   Cardiovascular: Negative.     Social History   Tobacco Use  . Smoking status: Never Smoker  . Smokeless tobacco: Never Used  Substance Use Topics  . Alcohol use: Yes    Comment: rarely      Objective:   BP 130/84 (BP Location: Left Arm, Patient Position: Sitting, Cuff Size: Normal)   Pulse (!) 108   Temp (!) 97.5 F (36.4 C) (Temporal)   Resp 16   Ht 5\' 6"  (1.676 m)   Wt 178 lb (80.7 kg)   LMP 12/02/2018 (Approximate)   BMI 28.73 kg/m  Vitals:   12/16/18 1559  BP: 130/84  Pulse: (!) 108  Resp: 16  Temp: (!) 97.5 F (36.4 C)  TempSrc: Temporal  Weight: 178 lb (80.7 kg)  Height: 5\' 6"  (1.676 m)  Body mass index is 28.73 kg/m.   Physical Exam Constitutional:      Appearance: Normal appearance.  Cardiovascular:     Rate and Rhythm: Normal rate and regular rhythm.     Heart sounds: Normal heart sounds.  Pulmonary:     Effort: Pulmonary effort is normal.     Breath sounds: Normal breath sounds.  Skin:    General: Skin is warm and dry.    Neurological:     Mental Status: She is alert and oriented to person, place, and time. Mental status is at baseline.      No results found for any visits on 12/16/18.     Assessment & Plan    1. Encounter for initial prescription of contraceptive pills  - norgestimate-ethinyl estradiol ([place] 28) 0.25-35 MG-MCG tablet; Take 1 tablet by mouth daily.  Dispense: 3 Package; Refill: 3  2. Schizophrenia, unspecified type [facility])  - Ambulatory referral to Psychiatry        Trinna Post, PA-C  [place]

## 2019-01-02 ENCOUNTER — Other Ambulatory Visit: Payer: Self-pay

## 2019-01-02 ENCOUNTER — Encounter: Payer: Self-pay | Admitting: Maternal Newborn

## 2019-01-02 ENCOUNTER — Ambulatory Visit (INDEPENDENT_AMBULATORY_CARE_PROVIDER_SITE_OTHER): Payer: BC Managed Care – PPO | Admitting: Maternal Newborn

## 2019-01-02 VITALS — BP 110/60 | Ht 66.0 in | Wt 176.0 lb

## 2019-01-02 DIAGNOSIS — Z30011 Encounter for initial prescription of contraceptive pills: Secondary | ICD-10-CM

## 2019-01-02 DIAGNOSIS — R519 Headache, unspecified: Secondary | ICD-10-CM

## 2019-01-02 DIAGNOSIS — Z01419 Encounter for gynecological examination (general) (routine) without abnormal findings: Secondary | ICD-10-CM

## 2019-01-02 DIAGNOSIS — R102 Pelvic and perineal pain: Secondary | ICD-10-CM

## 2019-01-02 DIAGNOSIS — Z3041 Encounter for surveillance of contraceptive pills: Secondary | ICD-10-CM

## 2019-01-02 MED ORDER — NORGESTIMATE-ETH ESTRADIOL 0.25-35 MG-MCG PO TABS: 1.0000 | ORAL_TABLET | Freq: Every day | ORAL | 4 refills | Status: DC
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
## 2019-01-02 NOTE — Patient Instructions (Signed)
Preventive Care 21-22 Years Old, Female Preventive care refers to visits with your health care provider and lifestyle choices that can promote health and wellness. This includes:  A yearly physical exam. This may also be called an annual well check.  Regular dental visits and eye exams.  Immunizations.  Screening for certain conditions.  Healthy lifestyle choices, such as eating a healthy diet, getting regular exercise, not using drugs or products that contain nicotine and tobacco, and limiting alcohol use. What can I expect for my preventive care visit? Physical exam Your health care provider will check your:  Height and weight. This may be used to calculate body mass index (BMI), which tells if you are at a healthy weight.  Heart rate and blood pressure.  Skin for abnormal spots. Counseling Your health care provider may ask you questions about your:  Alcohol, tobacco, and drug use.  Emotional well-being.  Home and relationship well-being.  Sexual activity.  Eating habits.  Work and work environment.  Method of birth control.  Menstrual cycle.  Pregnancy history. What immunizations do I need?  Influenza (flu) vaccine  This is recommended every year. Tetanus, diphtheria, and pertussis (Tdap) vaccine  You may need a Td booster every 10 years. Varicella (chickenpox) vaccine  You may need this if you have not been vaccinated. Human papillomavirus (HPV) vaccine  If recommended by your health care provider, you may need three doses over 6 months. Measles, mumps, and rubella (MMR) vaccine  You may need at least one dose of MMR. You may also need a second dose. Meningococcal conjugate (MenACWY) vaccine  One dose is recommended if you are age 19-21 years and a first-year college student living in a residence hall, or if you have one of several medical conditions. You may also need additional booster doses. Pneumococcal conjugate (PCV13) vaccine  You may need  this if you have certain conditions and were not previously vaccinated. Pneumococcal polysaccharide (PPSV23) vaccine  You may need one or two doses if you smoke cigarettes or if you have certain conditions. Hepatitis A vaccine  You may need this if you have certain conditions or if you travel or work in places where you may be exposed to hepatitis A. Hepatitis B vaccine  You may need this if you have certain conditions or if you travel or work in places where you may be exposed to hepatitis B. Haemophilus influenzae type b (Hib) vaccine  You may need this if you have certain conditions. You may receive vaccines as individual doses or as more than one vaccine together in one shot (combination vaccines). Talk with your health care provider about the risks and benefits of combination vaccines. What tests do I need?  Blood tests  Lipid and cholesterol levels. These may be checked every 5 years starting at age 20.  Hepatitis C test.  Hepatitis B test. Screening  Diabetes screening. This is done by checking your blood sugar (glucose) after you have not eaten for a while (fasting).  Sexually transmitted disease (STD) testing.  BRCA-related cancer screening. This may be done if you have a family history of breast, ovarian, tubal, or peritoneal cancers.  Pelvic exam and Pap test. This may be done every 3 years starting at age 21. Starting at age 30, this may be done every 5 years if you have a Pap test in combination with an HPV test. Talk with your health care provider about your test results, treatment options, and if necessary, the need for more tests.   Follow these instructions at home: Eating and drinking   Eat a diet that includes fresh fruits and vegetables, whole grains, lean protein, and low-fat dairy.  Take vitamin and mineral supplements as recommended by your health care provider.  Do not drink alcohol if: ? Your health care provider tells you not to drink. ? You are  pregnant, may be pregnant, or are planning to become pregnant.  If you drink alcohol: ? Limit how much you have to 0-1 drink a day. ? Be aware of how much alcohol is in your drink. In the [place] one drink equals one 12 oz bottle of beer (355 mL), one 5 oz glass of wine (148 mL), or one 1 oz glass of hard liquor (44 mL). Lifestyle  Take daily care of your teeth and gums.  Stay active. Exercise for at least 30 minutes on 5 or more days each week.  Do not use any products that contain nicotine or tobacco, such as cigarettes, e-cigarettes, and chewing tobacco. If you need help quitting, ask your health care provider.  If you are sexually active, practice safe sex. Use a condom or other form of birth control (contraception) in order to prevent pregnancy and STIs (sexually transmitted infections). If you plan to become pregnant, see your health care provider for a preconception visit. What's next?  Visit your health care provider once a year for a well check visit.  Ask your health care provider how often you should have your eyes and teeth checked.  Stay up to date on all vaccines. This information is not intended to replace advice given to you by your health care provider. Make sure you discuss any questions you have with your health care provider. Document Released: 04/25/2001 Document Revised: 11/08/2017 Document Reviewed: 11/08/2017 Elsevier Patient Education  2020 [employer]  

## 2019-01-05 LAB — POCT LIPID PANEL
HDL: 62
Non-HDL: 187
TC/HDL: 4
TC: 250
TRG: 457

## 2019-01-06 DIAGNOSIS — F901 Attention-deficit hyperactivity disorder, predominantly hyperactive type: Secondary | ICD-10-CM | POA: Diagnosis not present

## 2019-01-06 DIAGNOSIS — F250 Schizoaffective disorder, bipolar type: Secondary | ICD-10-CM | POA: Diagnosis not present

## 2019-01-08 ENCOUNTER — Encounter: Payer: Self-pay | Admitting: Neurology

## 2019-01-09 DIAGNOSIS — Z79899 Other long term (current) drug therapy: Secondary | ICD-10-CM | POA: Diagnosis not present

## 2019-01-09 DIAGNOSIS — F250 Schizoaffective disorder, bipolar type: Secondary | ICD-10-CM | POA: Diagnosis not present

## 2019-01-13 ENCOUNTER — Other Ambulatory Visit: Payer: Self-pay | Admitting: *Deleted

## 2019-01-13 ENCOUNTER — Ambulatory Visit: Payer: BC Managed Care – PPO | Admitting: Physician Assistant

## 2019-01-13 DIAGNOSIS — F901 Attention-deficit hyperactivity disorder, predominantly hyperactive type: Secondary | ICD-10-CM | POA: Diagnosis not present

## 2019-01-13 DIAGNOSIS — F250 Schizoaffective disorder, bipolar type: Secondary | ICD-10-CM | POA: Diagnosis not present

## 2019-01-13 DIAGNOSIS — Z20822 Contact with and (suspected) exposure to covid-19: Secondary | ICD-10-CM

## 2019-01-14 LAB — NOVEL CORONAVIRUS, NAA: SARS-CoV-2, NAA: NOT DETECTED

## 2019-01-15 ENCOUNTER — Other Ambulatory Visit: Payer: Self-pay

## 2019-01-15 ENCOUNTER — Telehealth: Payer: Self-pay

## 2019-01-15 DIAGNOSIS — Z008 Encounter for other general examination: Secondary | ICD-10-CM

## 2019-01-15 NOTE — Progress Notes (Signed)
     Patient ID: Olivia Thomas, female    DOB: [birth year], 23 y.o.   MRN: [id number]    Thank you!!  Apolonio Schneiders RN  [facility] [place]: [phone]  Cell:  [phone] Website: [email or web address]

## 2019-01-15 NOTE — Progress Notes (Signed)
Patient: Olivia Thomas Female    DOB: [birth year]   23 y.o.   MRN: [id number] Visit Date: 01/16/2019  Today's Provider: Trey Sailors, PA-C   Chief Complaint  Patient presents with  . Cough   Subjective:    Virtual Visit via Telephone Note  I connected with Olivia Thomas on 01/16/19 at  9:40 AM EST by telephone and verified that I am speaking with the correct person using two identifiers.  Location: Patient: Home Provider: Office   I discussed the limitations, risks, security and privacy concerns of performing an evaluation and management service by telephone and the availability of in person appointments. I also discussed with the patient that there may be a patient responsible charge related to this service. The patient expressed understanding and agreed to proceed.   URI  This is a new problem. The current episode started in the past 7 days. The problem has been gradually worsening. There has been no fever. Associated symptoms include congestion, coughing, headaches, nausea, rhinorrhea, sneezing, a sore throat and vomiting. Pertinent negatives include no abdominal pain, chest pain, diarrhea, dysuria, ear pain, joint pain, joint swelling, neck pain, plugged ear sensation, rash, sinus pain, swollen glands or wheezing. She has tried acetaminophen for the symptoms. The treatment provided mild relief.   g    Allergies  Allergen Reactions  . Kiwi Extract Swelling    oral     Current Outpatient Medications:  .  divalproex (DEPAKOTE ER) 250 MG 24 hr tablet, , Disp: , Rfl:  .  norgestimate-ethinyl estradiol (SPRINTEC 28) 0.25-35 MG-MCG tablet, Take 1 tablet by mouth daily., Disp: 3 Package, Rfl: 4 .  risperiDONE (RISPERDAL) 1 MG tablet, Take 1 mg by mouth at bedtime., Disp: , Rfl:  .  traZODone (DESYREL) 50 MG tablet, Take 0.5-1 tablets (25-50 mg total) by mouth at bedtime as needed for sleep., Disp: 90 tablet, Rfl: 1  Review of Systems  HENT: Positive for  congestion, rhinorrhea, sneezing and sore throat. Negative for ear pain and sinus pain.   Respiratory: Positive for cough. Negative for wheezing.   Cardiovascular: Negative for chest pain.  Gastrointestinal: Positive for nausea and vomiting. Negative for abdominal pain and diarrhea.  Genitourinary: Negative for dysuria.  Musculoskeletal: Negative for joint pain and neck pain.  Skin: Negative for rash.  Neurological: Positive for headaches.    Social History   Tobacco Use  . Smoking status: Never Smoker  . Smokeless tobacco: Never Used  Substance Use Topics  . Alcohol use: Yes    Comment: rarely      Objective:   Wt 172 lb (78 kg)   LMP 12/19/2018 (Approximate)   BMI 27.76 kg/m  Vitals:   01/16/19 0904  Weight: 172 lb (78 kg)  Body mass index is 27.76 kg/m.   Physical Exam   No results found for any visits on 01/16/19.     Assessment & Plan    1. Cough  Discussed COVID precautions. She has tested negative but remains with cough. I suggested she take allergy medication like Xyzal once daily to help with congestion and post nasal drip which is probably contributing to cough. Work note provided.    I discussed the assessment and treatment plan with the patient. The patient was provided an opportunity to ask questions and all were answered. The patient agreed with the plan and demonstrated an understanding of the instructions.   The patient was advised to call back or seek an  in-person evaluation if the symptoms worsen or if the condition fails to improve as anticipated.  I provided 15 minutes of non-face-to-face time during this encounter.  The entirety of the information documented in the History of Present Illness, Review of Systems and Physical Exam were personally obtained by me. Portions of this information were initially documented by Jennings Books, CMA and reviewed by me for thoroughness and accuracy.         Trinna Post, PA-C  [facility]

## 2019-01-15 NOTE — Telephone Encounter (Signed)
Received call from patient stating her COVID-19 test was negative. She still states she is having nasal congestion, headache and cough. I have advised her to follow up with her PCP

## 2019-01-16 ENCOUNTER — Other Ambulatory Visit: Payer: Self-pay

## 2019-01-16 ENCOUNTER — Encounter: Payer: Self-pay | Admitting: Physician Assistant

## 2019-01-16 ENCOUNTER — Ambulatory Visit (INDEPENDENT_AMBULATORY_CARE_PROVIDER_SITE_OTHER): Payer: BC Managed Care – PPO

## 2019-01-16 ENCOUNTER — Ambulatory Visit (INDEPENDENT_AMBULATORY_CARE_PROVIDER_SITE_OTHER): Payer: BC Managed Care – PPO | Admitting: Obstetrics and Gynecology

## 2019-01-16 ENCOUNTER — Ambulatory Visit (INDEPENDENT_AMBULATORY_CARE_PROVIDER_SITE_OTHER): Payer: BC Managed Care – PPO | Admitting: Physician Assistant

## 2019-01-16 ENCOUNTER — Encounter: Payer: Self-pay | Admitting: Obstetrics and Gynecology

## 2019-01-16 VITALS — BP 100/60 | Ht 66.0 in | Wt 178.0 lb

## 2019-01-16 VITALS — Wt 172.0 lb

## 2019-01-16 DIAGNOSIS — R102 Pelvic and perineal pain: Secondary | ICD-10-CM | POA: Diagnosis not present

## 2019-01-16 DIAGNOSIS — G8929 Other chronic pain: Secondary | ICD-10-CM | POA: Diagnosis not present

## 2019-01-16 DIAGNOSIS — N939 Abnormal uterine and vaginal bleeding, unspecified: Secondary | ICD-10-CM | POA: Diagnosis not present

## 2019-01-16 DIAGNOSIS — N941 Unspecified dyspareunia: Secondary | ICD-10-CM

## 2019-01-16 DIAGNOSIS — R059 Cough, unspecified: Secondary | ICD-10-CM

## 2019-01-16 DIAGNOSIS — R05 Cough: Secondary | ICD-10-CM | POA: Diagnosis not present
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
## 2019-01-20 DIAGNOSIS — F901 Attention-deficit hyperactivity disorder, predominantly hyperactive type: Secondary | ICD-10-CM | POA: Diagnosis not present

## 2019-01-20 DIAGNOSIS — F250 Schizoaffective disorder, bipolar type: Secondary | ICD-10-CM | POA: Diagnosis not present

## 2019-01-27 ENCOUNTER — Ambulatory Visit: Payer: BC Managed Care – PPO | Admitting: Physician Assistant

## 2019-01-27 ENCOUNTER — Telehealth: Payer: Self-pay | Admitting: Obstetrics and Gynecology

## 2019-01-27 DIAGNOSIS — F901 Attention-deficit hyperactivity disorder, predominantly hyperactive type: Secondary | ICD-10-CM | POA: Diagnosis not present

## 2019-01-27 DIAGNOSIS — F250 Schizoaffective disorder, bipolar type: Secondary | ICD-10-CM | POA: Diagnosis not present

## 2019-01-27 NOTE — Telephone Encounter (Signed)
Patient is aware of H&P at [facility] on 12/18 @ 10:10am w/ Dr. Georgianne Fick, Pre-admit testing phone interview to be scheduled, COVID testing on 12/29, and OR on 03/13/19. Patient is aware to quarantine after COVID testing. Patient confirmed BCBS and no secondary insurance. Patient will drop off FMLA paperwork and is aware of $25 fee.

## 2019-01-27 NOTE — Progress Notes (Deleted)
   {  Method of visit:23308}  Patient: Olivia Thomas Female    DOB: [birth year]   23 y.o.   MRN: [id number] Visit Date: 01/27/2019  Today's Provider: Trinna Post, PA-C   No chief complaint on file.  Subjective:     HPI  Allergies  Allergen Reactions  . Kiwi Extract Swelling    oral     Current Outpatient Medications:  .  divalproex (DEPAKOTE ER) 250 MG 24 hr tablet, , Disp: , Rfl:  .  norgestimate-ethinyl estradiol (SPRINTEC 28) 0.25-35 MG-MCG tablet, Take 1 tablet by mouth daily., Disp: 3 Package, Rfl: 4 .  risperiDONE (RISPERDAL) 1 MG tablet, Take 1 mg by mouth at bedtime., Disp: , Rfl:  .  traZODone (DESYREL) 50 MG tablet, Take 0.5-1 tablets (25-50 mg total) by mouth at bedtime as needed for sleep., Disp: 90 tablet, Rfl: 1  Review of Systems  Constitutional: Negative.   Respiratory: Negative.   Genitourinary: Negative.   Neurological: Negative.     Social History   Tobacco Use  . Smoking status: Never Smoker  . Smokeless tobacco: Never Used  Substance Use Topics  . Alcohol use: Yes    Comment: rarely      Objective:   There were no vitals taken for this visit. There were no vitals filed for this visit.There is no height or weight on file to calculate BMI.   Physical Exam   No results found for any visits on 01/27/19.     Assessment & [place], PA-C  [facility]

## 2019-01-27 NOTE — Telephone Encounter (Signed)
-----   Message from Malachy Mood, MD sent at 01/16/2019  8:18 PM EST ----- Regarding: Surgery Surgery Booking Request Patient Full Name:  Olivia Thomas  MRN: [id number]  DOB: 1996-02-15  Surgeon: Malachy Mood, MD  Requested Surgery Date and Time: 2-10 weeks Primary Diagnosis AND Code: Pelvic pain R10.2 Secondary Diagnosis and Code: Dysparunia N94.10 Surgical Procedure: Diagnostic Laparoscopy L&D Notification: No Admission Status: same day surgery Length of Surgery: 1hr Special Case Needs: No H&P: Yes Phone Interview???:  Yes Interpreter: No Language:  Medical Clearance:  No Special Scheduling Instructions: nonw Any known health/anesthesia issues, diabetes, sleep apnea, latex allergy, defibrillator/pacemaker?: No Acuity: P4   (P1 highest, P2 delay may cause harm, P3 low, elective gyn, P4 lowest)

## 2019-02-03 DIAGNOSIS — F250 Schizoaffective disorder, bipolar type: Secondary | ICD-10-CM | POA: Diagnosis not present

## 2019-02-04 ENCOUNTER — Encounter: Payer: Self-pay | Admitting: Neurology

## 2019-02-05 ENCOUNTER — Telehealth: Payer: Self-pay

## 2019-02-05 NOTE — Telephone Encounter (Signed)
FMLA/DISABILITY form for [employer] filled out, signature obtained and given to KT for processing. 

## 2019-02-10 DIAGNOSIS — F901 Attention-deficit hyperactivity disorder, predominantly hyperactive type: Secondary | ICD-10-CM | POA: Diagnosis not present
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
## 2019-02-11 ENCOUNTER — Ambulatory Visit: Payer: BC Managed Care – PPO | Admitting: Neurology

## 2019-02-24 DIAGNOSIS — F250 Schizoaffective disorder, bipolar type: Secondary | ICD-10-CM | POA: Diagnosis not present
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
## 2019-02-28 ENCOUNTER — Encounter: Payer: Self-pay | Admitting: Obstetrics and Gynecology

## 2019-02-28 ENCOUNTER — Other Ambulatory Visit: Payer: Self-pay

## 2019-02-28 ENCOUNTER — Ambulatory Visit (INDEPENDENT_AMBULATORY_CARE_PROVIDER_SITE_OTHER): Payer: BC Managed Care – PPO | Admitting: Obstetrics and Gynecology

## 2019-02-28 VITALS — BP 126/84 | HR 115 | Ht 66.0 in | Wt 182.0 lb

## 2019-02-28 DIAGNOSIS — Z01818 Encounter for other preprocedural examination: Secondary | ICD-10-CM

## 2019-03-03 ENCOUNTER — Other Ambulatory Visit: Payer: Self-pay

## 2019-03-03 ENCOUNTER — Encounter: Payer: Self-pay | Admitting: Emergency Medicine

## 2019-03-03 ENCOUNTER — Other Ambulatory Visit: Admission: RE | Admit: 2019-03-03 | Payer: BC Managed Care – PPO | Source: Ambulatory Visit

## 2019-03-03 ENCOUNTER — Ambulatory Visit
Admission: EM | Admit: 2019-03-03 | Discharge: 2019-03-03 | Disposition: A | Discharge status: Home / Self Care | Payer: BC Managed Care – PPO | Attending: Family Medicine | Admitting: Family Medicine

## 2019-03-03 DIAGNOSIS — R300 Dysuria: Secondary | ICD-10-CM | POA: Diagnosis not present

## 2019-03-03 DIAGNOSIS — R6883 Chills (without fever): Secondary | ICD-10-CM

## 2019-03-03 DIAGNOSIS — R112 Nausea with vomiting, unspecified: Secondary | ICD-10-CM | POA: Diagnosis not present

## 2019-03-03 DIAGNOSIS — M545 Low back pain: Secondary | ICD-10-CM

## 2019-03-03 DIAGNOSIS — N390 Urinary tract infection, site not specified: Secondary | ICD-10-CM

## 2019-03-03 DIAGNOSIS — F250 Schizoaffective disorder, bipolar type: Secondary | ICD-10-CM | POA: Diagnosis not present

## 2019-03-03 LAB — URINALYSIS, COMPLETE (UACMP) WITH MICROSCOPIC
Bilirubin Urine: NEGATIVE
Glucose, UA: NEGATIVE mg/dL
Ketones, ur: NEGATIVE mg/dL
Nitrite: NEGATIVE
Protein, ur: 30 mg/dL — AB
Specific Gravity, Urine: 1.02 (ref 1.005–1.030)
pH: 7 (ref 5.0–8.0)

## 2019-03-03 MED ORDER — CEFDINIR 300 MG PO CAPS: 300.0000 mg | ORAL_CAPSULE | Freq: Two times a day (BID) | ORAL | 0 refills | Status: DC

## 2019-03-03 NOTE — ED Triage Notes (Signed)
Pt c/o lower back pain, dysuria, chills, nausea and vomiting. Started about a week and a half ago, she took some azo and thought it was getting better then about 3 days ago she started getting worse.

## 2019-03-03 NOTE — ED Provider Notes (Signed)
[facility]    CSN: [id number] Arrival date & time: 03/03/19  1618  History   Chief Complaint Chief Complaint  Patient presents with  . Dysuria  . Back Pain   HPI  23 year old female presents with urinary symptoms.  Patient reports that her symptoms started 1.5 weeks ago.  She states that she hydrated aggressively, took cranberry and Azo and had some improvement.  She states that over the past 3 days her symptoms have been worsening again.  She reports low back pain, dysuria, chills.  Patient also reports that she has had some nausea and vomiting.  No documented fever.  No relieving factors at this point.  No known exacerbating factors.  Patient is concerned that she has a "kidney infection".  No other complaints or concerns at this time.  PMH, Surgical Hx, Family Hx, Social History reviewed and updated as below.  Past Medical History:  Diagnosis Date  . Anxiety   . Bipolar 1 disorder, depressed, full remission ([facility])   . Depression    Patient Active Problem List   Diagnosis Date Noted  . Supervision of high risk pregnancy, antepartum 12/31/2017  . Bipolar disease during pregnancy ([facility]) 12/31/2017  . Obesity affecting pregnancy 12/31/2017  . BMI 30.0-30.9,adult 12/31/2017  . Irregular bleeding 09/28/2014  . Anxiety 09/28/2014    Past Surgical History:  Procedure Laterality Date  . WISDOM TOOTH EXTRACTION      OB History    Gravida  1   Para      Term      Preterm      AB  1   Living        SAB      TAB      Ectopic      Multiple      Live Births              Home Medications    Prior to Admission medications   Medication Sig Start Date End Date Taking? Authorizing Provider  divalproex (DEPAKOTE ER) 250 MG 24 hr tablet Take 250 mg by mouth 3 (three) times daily.  01/09/19  Yes [provider]  hydrOXYzine (ATARAX/VISTARIL) 25 MG tablet Take 25-50 mg by mouth daily.  02/03/19  Yes [provider]  ibuprofen  (ADVIL) 200 MG tablet Take 800 mg by mouth every 6 (six) hours as needed for moderate pain.   Yes [provider]  norgestimate-ethinyl estradiol (SPRINTEC 28) 0.25-35 MG-MCG tablet Take 1 tablet by mouth daily. 01/02/19  Yes Oswaldo ConroySchmid, Jacelyn Y, CNM  risperiDONE (RISPERDAL) 1 MG tablet Take 1 mg by mouth at bedtime. 01/09/19  Yes [provider]  traZODone (DESYREL) 50 MG tablet Take 0.5-1 tablets (25-50 mg total) by mouth at bedtime as needed for sleep. 02/27/18  Yes Eappen, Levin BaconSaramma, MD  cefdinir (OMNICEF) 300 MG capsule Take 1 capsule (300 mg total) by mouth 2 (two) times daily. 03/03/19   Tommie Samsook, Natassja Ollis G, DO    Family History Family History  Problem Relation Age of Onset  . Autoimmune disease Mother   . Anxiety disorder Mother   . Depression Mother   . Osteoarthritis Father   . Depression Father   . Bipolar disorder Maternal Aunt     Social History Social History   Tobacco Use  . Smoking status: Never Smoker  . Smokeless tobacco: Never Used  Substance Use Topics  . Alcohol use: Yes    Comment: rarely  . Drug use: No  Allergies   Kiwi extract   Review of Systems Review of Systems  Constitutional: Positive for chills. Negative for fever.  Gastrointestinal: Positive for nausea and vomiting.  Genitourinary: Positive for dysuria.  Musculoskeletal: Positive for back pain.   Physical Exam Triage Vital Signs ED Triage Vitals  Enc Vitals Group     BP 03/03/19 1635 123/82     Pulse Rate 03/03/19 1635 (!) 104     Resp 03/03/19 1635 18     Temp 03/03/19 1635 98.4 F (36.9 C)     Temp Source 03/03/19 1635 Oral     SpO2 03/03/19 1635 100 %     Weight 03/03/19 1632 175 lb (79.4 kg)     Height 03/03/19 1632 5\' 6"  (1.676 m)     Head Circumference --      Peak Flow --      Pain Score 03/03/19 1632 5     Pain Loc --      Pain Edu? --      Excl. in GC? --    Updated Vital Signs BP 123/82 (BP Location: Left Arm)   Pulse (!) 104   Temp 98.4 F (36.9 C)  (Oral)   Resp 18   Ht 5\' 6"  (1.676 m)   Wt 79.4 kg   SpO2 100%   BMI 28.25 kg/m   Visual Acuity Right Eye Distance:   Left Eye Distance:   Bilateral Distance:    Right Eye Near:   Left Eye Near:    Bilateral Near:     Physical Exam Vitals and nursing note reviewed.  Constitutional:      General: She is not in acute distress.    Appearance: Normal appearance. She is not ill-appearing.  HENT:     Head: Normocephalic and atraumatic.  Eyes:     General:        Right eye: No discharge.        Left eye: No discharge.     Conjunctiva/sclera: Conjunctivae normal.  Cardiovascular:     Rate and Rhythm: Regular rhythm. Tachycardia present.     Heart sounds: No murmur.  Pulmonary:     Effort: Pulmonary effort is normal.     Breath sounds: Normal breath sounds. No wheezing, rhonchi or rales.  Abdominal:     General: There is no distension.     Palpations: Abdomen is soft.     Comments: Mild CVA tenderness.  Neurological:     Mental Status: She is alert.  Psychiatric:        Mood and Affect: Mood normal.        Behavior: Behavior normal.    UC Treatments / Results  Labs (all labs ordered are listed, but only abnormal results are displayed) Labs Reviewed  URINALYSIS, COMPLETE (UACMP) WITH MICROSCOPIC - Abnormal; Notable for the following components:      Result Value   APPearance HAZY (*)    Hgb urine dipstick TRACE (*)    Protein, ur 30 (*)    Leukocytes,Ua TRACE (*)    Bacteria, UA FEW (*)    All other components within normal limits  URINE CULTURE    EKG   Radiology No results found.  Procedures Procedures (including critical care time)  Medications Ordered in UC Medications - No data to display  Initial Impression / Assessment and Plan / UC Course  I have reviewed the triage vital signs and the nursing notes.  Pertinent labs & imaging results that were available during my care  of the patient were reviewed by me and considered in my medical decision  making (see chart for details).    23 year old female presents with suspected urinary tract infection.  Concern for pyelonephritis.  Placing her on Omnicef.  Sending culture.  Final Clinical Impressions(s) / UC Diagnoses   Final diagnoses:  Urinary tract infection without hematuria, site unspecified     Discharge Instructions     Antibiotic as prescribed.  Take care  Dr. Lacinda Axon    ED Prescriptions    Medication Sig Dispense Auth. Provider   cefdinir (OMNICEF) 300 MG capsule Take 1 capsule (300 mg total) by mouth 2 (two) times daily. 14 capsule Thersa Salt G, DO     PDMP not reviewed this encounter.   Coral Spikes, [place] 03/03/19 1759

## 2019-03-03 NOTE — Discharge Instructions (Addendum)
Antibiotic as prescribed.  Take care  Dr. Radford Pease  

## 2019-03-04 ENCOUNTER — Encounter
Admission: RE | Admit: 2019-03-04 | Discharge: 2019-03-04 | Disposition: A | Discharge status: Home / Self Care | Payer: BC Managed Care – PPO | Source: Ambulatory Visit | Attending: Obstetrics and Gynecology | Admitting: Obstetrics and Gynecology

## 2019-03-04 ENCOUNTER — Other Ambulatory Visit: Payer: Self-pay

## 2019-03-04 DIAGNOSIS — Z01818 Encounter for other preprocedural examination: Secondary | ICD-10-CM | POA: Insufficient documentation

## 2019-03-04 NOTE — Patient Instructions (Signed)
Your procedure is scheduled on: Thurs 12/31 Report to Day Surgery. To find out your arrival time please call [phone] between [place] on Wed. 12/30  Remember: Instructions that are not followed completely may result in serious medical risk,  up to and including death, or upon the discretion of your surgeon and anesthesiologist your  surgery may need to be rescheduled.     _X__ 1. Do not eat food after midnight the night before your procedure.                 No gum chewing or hard candies. You may drink clear liquids up to 2 hours                 before you are scheduled to arrive for your surgery- DO not drink clear                 liquids within 2 hours of the start of your surgery.                 Clear Liquids include:  water, Mccarrick juice without pulp, clear carbohydrate                 drink such as Clearfast of Gatorade, Black Coffee or Tea (Do not add                 anything to coffee or tea).  __X__2.  On the morning of surgery brush your teeth with toothpaste and water, you                may rinse your mouth with mouthwash if you wish.  Do not swallow any toothpaste of mouthwash.     _X__ 3.  No Alcohol for 24 hours before or after surgery.   ___ 4.  Do Not Smoke or use e-cigarettes For 24 Hours Prior to Your Surgery.                 Do not use any chewable tobacco products for at least 6 hours prior to                 surgery.  ____  5.  Bring all medications with you on the day of surgery if instructed.   __x__  6.  Notify your doctor if there is any change in your medical condition      (cold, fever, infections).     Do not wear jewelry, make-up, hairpins, clips or nail polish. Do not wear lotions, powders, or perfumes. You may wear deodorant. Do not shave 48 hours prior to surgery. Men may shave face and neck. Do not bring valuables to the hospital.    [facility] is not responsible for any belongings or valuables.  Contacts, dentures  or bridgework may not be worn into surgery. Leave your suitcase in the car. After surgery it may be brought to your room. For patients admitted to the hospital, discharge time is determined by your treatment team.   Patients discharged the day of surgery will not be allowed to drive home.   Please read over the following fact sheets that you were given:  x  ____ Take these medicines the morning of surgery with A SIP OF WATER:    1. divalproex (DEPAKOTE ER) 250 MG 24 hr tablet  2. hydrOXYzine (ATARAX/VISTARIL) 25 MG tablet  3.   4.  5.  6.  ____ Fleet Enema (as directed)   _x___ Use CHG  Soap as directed  ____ Use inhalers on the day of surgery  ____ Stop metformin 2 days prior to surgery    ____ Take 1/2 of usual insulin dose the night before surgery. No insulin the morning          of surgery.   ____ Stop Coumadin/Plavix/aspirin on   __x__ Stop Anti-inflammatories  ibuprofen (ADVIL) 200 MG tablet, Aleve or aspirin    May take tylenol   ____ Stop supplements until after surgery.    ____ Bring C-Pap to the hospital.

## 2019-03-06 ENCOUNTER — Telehealth: Payer: Self-pay | Admitting: Emergency Medicine

## 2019-03-06 LAB — URINE CULTURE: Culture: >=100000 — AB

## 2019-03-06 MED ORDER — NITROFURANTOIN MONOHYD MACRO 100 MG PO CAPS: 100.0000 mg | ORAL_CAPSULE | Freq: Two times a day (BID) | ORAL | 0 refills | Status: DC

## 2019-03-06 NOTE — Telephone Encounter (Signed)
Per Claiborne Billings, pt needs to stop cefdinir and send in macrobid bid x5 days. Patient contacted by phone and made aware of    results. Pt verbalized understanding and had all questions answered.

## 2019-03-11 ENCOUNTER — Other Ambulatory Visit: Payer: Self-pay

## 2019-03-11 ENCOUNTER — Other Ambulatory Visit
Admission: RE | Admit: 2019-03-11 | Discharge: 2019-03-11 | Disposition: A | Discharge status: Home / Self Care | Payer: BC Managed Care – PPO | Source: Ambulatory Visit | Attending: Obstetrics and Gynecology | Admitting: Obstetrics and Gynecology

## 2019-03-11 DIAGNOSIS — Z20828 Contact with and (suspected) exposure to other viral communicable diseases: Secondary | ICD-10-CM | POA: Insufficient documentation

## 2019-03-11 DIAGNOSIS — Z01812 Encounter for preprocedural laboratory examination: Secondary | ICD-10-CM | POA: Diagnosis not present

## 2019-03-12 LAB — SARS CORONAVIRUS 2 (TAT 6-24 HRS): SARS Coronavirus 2: NEGATIVE

## 2019-03-13 ENCOUNTER — Encounter
Admission: RE | Disposition: A | Discharge status: Home / Self Care | Payer: Self-pay | Source: Home / Self Care | Attending: Obstetrics and Gynecology

## 2019-03-13 ENCOUNTER — Other Ambulatory Visit: Payer: Self-pay

## 2019-03-13 ENCOUNTER — Ambulatory Visit
Admission: RE | Admit: 2019-03-13 | Discharge: 2019-03-13 | Disposition: A | Discharge status: Home / Self Care | Payer: BC Managed Care – PPO | Attending: Obstetrics and Gynecology | Admitting: Obstetrics and Gynecology

## 2019-03-13 ENCOUNTER — Encounter: Payer: Self-pay | Admitting: Obstetrics and Gynecology

## 2019-03-13 ENCOUNTER — Ambulatory Visit: Payer: BC Managed Care – PPO | Admitting: Anesthesiology

## 2019-03-13 DIAGNOSIS — K219 Gastro-esophageal reflux disease without esophagitis: Secondary | ICD-10-CM | POA: Insufficient documentation

## 2019-03-13 DIAGNOSIS — F319 Bipolar disorder, unspecified: Secondary | ICD-10-CM | POA: Diagnosis not present

## 2019-03-13 DIAGNOSIS — F419 Anxiety disorder, unspecified: Secondary | ICD-10-CM | POA: Insufficient documentation

## 2019-03-13 DIAGNOSIS — N803 Endometriosis of pelvic peritoneum: Secondary | ICD-10-CM | POA: Diagnosis not present

## 2019-03-13 DIAGNOSIS — Z9889 Other specified postprocedural states: Secondary | ICD-10-CM

## 2019-03-13 DIAGNOSIS — G8929 Other chronic pain: Secondary | ICD-10-CM | POA: Diagnosis not present

## 2019-03-13 DIAGNOSIS — R102 Pelvic and perineal pain: Secondary | ICD-10-CM | POA: Diagnosis not present

## 2019-03-13 DIAGNOSIS — G894 Chronic pain syndrome: Secondary | ICD-10-CM | POA: Diagnosis not present

## 2019-03-13 DIAGNOSIS — N941 Unspecified dyspareunia: Secondary | ICD-10-CM | POA: Diagnosis not present

## 2019-03-13 HISTORY — PX: LAPAROSCOPY: SHX197

## 2019-03-13 LAB — POCT PREGNANCY, URINE: Preg Test, Ur: NEGATIVE

## 2019-03-13 SURGERY — LAPAROSCOPY, DIAGNOSTIC
Anesthesia: General

## 2019-03-13 MED ORDER — LIDOCAINE HCL (CARDIAC) PF 100 MG/5ML IV SOSY
PREFILLED_SYRINGE | INTRAVENOUS | Status: DC | PRN
  Administered 2019-03-13: 100 mg via INTRAVENOUS

## 2019-03-13 MED ORDER — PROMETHAZINE HCL 25 MG/ML IJ SOLN: 6.2500 mg | INTRAMUSCULAR | Status: DC | PRN

## 2019-03-13 MED ORDER — DEXAMETHASONE SODIUM PHOSPHATE 10 MG/ML IJ SOLN
INTRAMUSCULAR | Status: DC | PRN
  Administered 2019-03-13: 10 mg via INTRAVENOUS

## 2019-03-13 MED ORDER — BUPIVACAINE HCL 0.5 % IJ SOLN
INTRAMUSCULAR | Status: DC | PRN
  Administered 2019-03-13: 8 mL

## 2019-03-13 MED ORDER — MEPERIDINE HCL 50 MG/ML IJ SOLN: 6.2500 mg | INTRAMUSCULAR | Status: DC | PRN

## 2019-03-13 MED ORDER — MIDAZOLAM HCL 2 MG/2ML IJ SOLN
INTRAMUSCULAR | Status: DC | PRN
  Administered 2019-03-13: 2 mg via INTRAVENOUS

## 2019-03-13 MED ORDER — FENTANYL CITRATE (PF) 100 MCG/2ML IJ SOLN
INTRAMUSCULAR | Status: AC
  Filled 2019-03-13: qty 2

## 2019-03-13 MED ORDER — LACTATED RINGERS IV SOLN: INTRAVENOUS | Status: DC

## 2019-03-13 MED ORDER — ROCURONIUM BROMIDE 100 MG/10ML IV SOLN
INTRAVENOUS | Status: DC | PRN
  Administered 2019-03-13: 35 mg via INTRAVENOUS

## 2019-03-13 MED ORDER — FENTANYL CITRATE (PF) 100 MCG/2ML IJ SOLN
INTRAMUSCULAR | Status: DC | PRN
  Administered 2019-03-13: 100 ug via INTRAVENOUS

## 2019-03-13 MED ORDER — ACETAMINOPHEN 325 MG PO TABS: 325.0000 mg | ORAL_TABLET | ORAL | Status: DC | PRN

## 2019-03-13 MED ORDER — MIDAZOLAM HCL 2 MG/2ML IJ SOLN
INTRAMUSCULAR | Status: AC
  Filled 2019-03-13: qty 2

## 2019-03-13 MED ORDER — OXYCODONE-ACETAMINOPHEN 5-325 MG PO TABS: 1.0000 | ORAL_TABLET | ORAL | 0 refills | Status: DC | PRN

## 2019-03-13 MED ORDER — ONDANSETRON HCL 4 MG/2ML IJ SOLN
INTRAMUSCULAR | Status: DC | PRN
  Administered 2019-03-13: 4 mg via INTRAVENOUS

## 2019-03-13 MED ORDER — PROPOFOL 10 MG/ML IV BOLUS
INTRAVENOUS | Status: DC | PRN
  Administered 2019-03-13: 150 mg via INTRAVENOUS

## 2019-03-13 MED ORDER — SUGAMMADEX SODIUM 500 MG/5ML IV SOLN
INTRAVENOUS | Status: DC | PRN
  Administered 2019-03-13: 200 mg via INTRAVENOUS

## 2019-03-13 MED ORDER — FAMOTIDINE 20 MG PO TABS
ORAL_TABLET | ORAL | Status: AC
  Filled 2019-03-13: qty 1

## 2019-03-13 MED ORDER — IBUPROFEN 600 MG PO TABS: 600.0000 mg | ORAL_TABLET | Freq: Four times a day (QID) | ORAL | 0 refills | Status: DC | PRN

## 2019-03-13 MED ORDER — FAMOTIDINE 20 MG PO TABS
20.0000 mg | ORAL_TABLET | Freq: Once | ORAL | Status: AC
  Administered 2019-03-13: 20 mg via ORAL

## 2019-03-13 MED ORDER — BUPIVACAINE HCL (PF) 0.5 % IJ SOLN
INTRAMUSCULAR | Status: AC
  Filled 2019-03-13: qty 30

## 2019-03-13 MED ORDER — KETOROLAC TROMETHAMINE 30 MG/ML IJ SOLN
INTRAMUSCULAR | Status: DC | PRN
  Administered 2019-03-13: 30 mg via INTRAVENOUS

## 2019-03-13 MED ORDER — KETOROLAC TROMETHAMINE 30 MG/ML IJ SOLN: 30.0000 mg | Freq: Once | INTRAMUSCULAR | Status: DC | PRN

## 2019-03-13 MED ORDER — LACTATED RINGERS IV SOLN: INTRAVENOUS | Status: DC | PRN

## 2019-03-13 MED ORDER — ACETAMINOPHEN 160 MG/5ML PO SOLN
325.0000 mg | ORAL | Status: DC | PRN
  Filled 2019-03-13: qty 20.3

## 2019-03-13 MED ORDER — FENTANYL CITRATE (PF) 100 MCG/2ML IJ SOLN: 25.0000 ug | INTRAMUSCULAR | Status: DC | PRN

## 2019-03-13 MED ORDER — HYDROCODONE-ACETAMINOPHEN 7.5-325 MG PO TABS
1.0000 | ORAL_TABLET | Freq: Once | ORAL | Status: DC | PRN
  Filled 2019-03-13: qty 1

## 2019-03-13 SURGICAL SUPPLY — 33 items
BLADE SURG SZ11 CARB STEEL (BLADE) ×2 IMPLANT
CANISTER SUCT 1200ML W/VALVE (MISCELLANEOUS) ×2 IMPLANT
CATH ROBINSON RED A/P 16FR (CATHETERS) ×2 IMPLANT
CHLORAPREP W/TINT 26 (MISCELLANEOUS) ×2 IMPLANT
COVER WAND RF STERILE (DRAPES) ×4 IMPLANT
DERMABOND ADVANCED (GAUZE/BANDAGES/DRESSINGS) ×1
DERMABOND ADVANCED .7 DNX12 (GAUZE/BANDAGES/DRESSINGS) ×1 IMPLANT
DRSG TEGADERM 2-3/8X2-3/4 SM (GAUZE/BANDAGES/DRESSINGS) ×2 IMPLANT
GLOVE BIO SURGEON STRL SZ7 (GLOVE) ×8 IMPLANT
GLOVE INDICATOR 7.5 STRL GRN (GLOVE) ×2 IMPLANT
GOWN STRL REUS W/ TWL LRG LVL3 (GOWN DISPOSABLE) ×2 IMPLANT
GOWN STRL REUS W/TWL LRG LVL3 (GOWN DISPOSABLE) ×2
IRRIGATION STRYKERFLOW (MISCELLANEOUS) ×1 IMPLANT
IRRIGATOR STRYKERFLOW (MISCELLANEOUS) ×2
IV LACTATED RINGERS 1000ML (IV SOLUTION) ×2 IMPLANT
KIT PINK PAD W/HEAD ARE REST (MISCELLANEOUS) ×2
KIT PINK PAD W/HEAD ARM REST (MISCELLANEOUS) ×1 IMPLANT
KIT TURNOVER CYSTO (KITS) ×2 IMPLANT
LABEL OR SOLS (LABEL) ×2 IMPLANT
NS IRRIG 500ML POUR BTL (IV SOLUTION) ×2 IMPLANT
PACK GYN LAPAROSCOPIC (MISCELLANEOUS) ×2 IMPLANT
PAD OB MATERNITY 4.3X12.25 (PERSONAL CARE ITEMS) ×2 IMPLANT
PAD PREP 24X41 OB/GYN DISP (PERSONAL CARE ITEMS) ×2 IMPLANT
SCISSORS METZENBAUM CVD 33 (INSTRUMENTS) IMPLANT
SET TUBE SMOKE EVAC HIGH FLOW (TUBING) ×2 IMPLANT
SHEARS HARMONIC ACE PLUS 36CM (ENDOMECHANICALS) ×2 IMPLANT
SLEEVE ENDOPATH XCEL 5M (ENDOMECHANICALS) IMPLANT
SPONGE GAUZE 2X2 8PLY STRL LF (GAUZE/BANDAGES/DRESSINGS) ×2 IMPLANT
SUT MNCRL AB 4-0 PS2 18 (SUTURE) ×2 IMPLANT
SUT VIC AB 0 CT2 27 (SUTURE) ×2 IMPLANT
SYR 10ML LL (SYRINGE) ×2 IMPLANT
TROCAR ENDO BLADELESS 11MM (ENDOMECHANICALS) IMPLANT
TROCAR XCEL NON-BLD 5MMX100MML (ENDOMECHANICALS) ×2 IMPLANT

## 2019-03-13 NOTE — Anesthesia Post-op Follow-up Note (Signed)
Anesthesia QCDR form completed.        

## 2019-03-13 NOTE — Anesthesia Procedure Notes (Addendum)
Procedure Name: Intubation Date/Time: 03/13/2019 9:06 AM Performed by: Justus Memory, CRNA Pre-anesthesia Checklist: Patient identified, Patient being monitored, Timeout performed, Emergency Drugs available and Suction available Patient Re-evaluated:Patient Re-evaluated prior to induction Oxygen Delivery Method: Circle system utilized Preoxygenation: Pre-oxygenation with 100% oxygen Induction Type: IV induction Ventilation: Mask ventilation without difficulty Laryngoscope Size: Mac and 3 Grade View: Grade I Tube type: Oral Tube size: 7.0 mm Number of attempts: 1 Airway Equipment and Method: Stylet Placement Confirmation: ETT inserted through vocal cords under direct vision,  positive ETCO2 and breath sounds checked- equal and bilateral Secured at: 20 cm Tube secured with: Tape Dental Injury: Teeth and Oropharynx as per pre-operative assessment

## 2019-03-13 NOTE — Anesthesia Preprocedure Evaluation (Addendum)
Anesthesia Evaluation  Patient identified by MRN, date of birth, ID band Patient awake    Reviewed: Allergy & Precautions, H&P , NPO status , reviewed documented beta blocker date and time   Airway Mallampati: II  TM Distance: >3 FB Neck ROM: full    Dental  (+) Teeth Intact   Pulmonary    Pulmonary exam normal        Cardiovascular Normal cardiovascular exam     Neuro/Psych PSYCHIATRIC DISORDERS Anxiety Depression Bipolar Disorder    GI/Hepatic GERD  Controlled,  Endo/Other    Renal/GU      Musculoskeletal   Abdominal   Peds  Hematology   Anesthesia Other Findings Past Medical History: No date: Anxiety No date: Bipolar 1 disorder, depressed, full remission ([place]) No date: Depression Past Surgical History: No date: INDUCED ABORTION No date: WISDOM TOOTH EXTRACTION BMI    Body Mass Index: 29.86 kg/m     Reproductive/Obstetrics                           Anesthesia Physical Anesthesia Plan  ASA: II  Anesthesia Plan: General   Post-op Pain Management:    Induction: Intravenous  PONV Risk Score and Plan: 4 or greater and Ondansetron, Treatment may vary due to age or medical condition, Midazolam, Dexamethasone and Promethazine  Airway Management Planned: Oral ETT  Additional Equipment:   Intra-op Plan:   Post-operative Plan: Extubation in OR  Informed Consent: I have reviewed the patients History and Physical, chart, labs and discussed the procedure including the risks, benefits and alternatives for the proposed anesthesia with the patient or authorized representative who has indicated his/her understanding and acceptance.     Dental Advisory Given  Plan Discussed with: CRNA  Anesthesia Plan Comments:       Anesthesia Quick Evaluation

## 2019-03-13 NOTE — Discharge Instructions (Signed)
AMBULATORY SURGERY  °DISCHARGE INSTRUCTIONS ° ° °1) The drugs that you were given will stay in your system until tomorrow so for the next 24 hours you should not: ° °A) Drive an automobile °B) Make any legal decisions °C) Drink any alcoholic beverage ° ° °2) You may resume regular meals tomorrow.  Today it is better to start with liquids and gradually work up to solid foods. ° °You may eat anything you prefer, but it is better to start with liquids, then soup and crackers, and gradually work up to solid foods. ° ° °3) Please notify your doctor immediately if you have any unusual bleeding, trouble breathing, redness and pain at the surgery site, drainage, fever, or pain not relieved by medication. ° °Please contact your physician with any problems or Same Day Surgery at [phone], Monday through Friday 6 am to 4 pm, or  at [place] Main number at [phone]. °

## 2019-03-13 NOTE — H&P (Signed)
Date of Initial H&P: 02/28/2019  History reviewed, patient examined, no change in status, stable for surgery.  

## 2019-03-13 NOTE — Transfer of Care (Signed)
Immediate Anesthesia Transfer of Care Note  Patient: Olivia Thomas  Procedure(s) Performed: LAPAROSCOPY DIAGNOSTIC (N/A )  Patient Location: PACU  Anesthesia Type:General  Level of Consciousness: awake, alert  and oriented  Airway & Oxygen Therapy: Patient Spontanous Breathing and Patient connected to face mask oxygen  Post-op Assessment: Report given to RN and Post -op Vital signs reviewed and stable  Post vital signs: Reviewed and stable  Last Vitals:  Vitals Value Taken Time  BP    Temp 36.1 C 03/13/19 0956  Pulse 93 03/13/19 0958  Resp 21 03/13/19 0958  SpO2 100 % 03/13/19 0958  Vitals shown include unvalidated device data.  Last Pain:  Vitals:   03/13/19 0722  TempSrc: Tympanic  PainSc: 2          Complications: No apparent anesthesia complications

## 2019-03-17 ENCOUNTER — Telehealth: Payer: Self-pay | Admitting: Obstetrics and Gynecology

## 2019-03-17 NOTE — Telephone Encounter (Signed)
Patient is requesting a Dr.'s note keeping her out of work until she is seen for he appointment for 03/21/19. Please advise

## 2019-03-17 NOTE — Telephone Encounter (Signed)
Yes that is fine

## 2019-03-17 NOTE — Telephone Encounter (Signed)
Do you approve of this letter?

## 2019-03-17 NOTE — Telephone Encounter (Signed)
Pt aware.

## 2019-03-19 NOTE — Op Note (Signed)
Preoperative Diagnosis: 1) 23 y.o. chronic pelvic pain  Postoperative Diagnosis: 1) 24 y.o. with endometriosis   Operation Performed: Diagnostic laparoscopy  Indication: 24 y.o. G1P0010  with chronic pelvic pain  Surgeon: Vena Austria, MD  Anesthesia: Choice  Preoperative Antibiotics: none  Estimated Blood Loss: 1 mL  IV Fluids:  Drains or Tubes: none  Implants: none  Specimens Removed: none  Complications: none  Intraoperative Findings: Normal tubes, ovaries, and uterus. Small areas of endometriotic implants left pelvic sidewall. Normal appendix, no evidence of venous congestion, normal liver edge.    Patient Condition: stable  Procedure in Detail:  Patient was taken to the operating room where she was administered general anesthesia.  She was positioned in the dorsal lithotomy position utilizing Allen stirups, prepped and draped in the usual sterile fashion.  Prior to proceeding with procedure a time out was performed.  Attention was turned to the patient's pelvis.  A red rubber catheter was used to empty the patient's bladder.  An operative speculum was placed to allow visualization of the cervix.  The anterior lip of the cervix was grasped with a single tooth tenaculum, and a Hulka tenaculum was placed to allow manipulation of the uterus.  The operative speculum and single tooth tenaculum were then removed.  Attention was turned to the patient's abdomen.  The umbilicus was infiltrated with 1% Sensorcaine, before making a stab incision using an 11 blade scalpel.  A 26mm Excel trocar was then used to gain direct entry into the peritoneal cavity utilizing the camera to visualize progress of the trocar during placement.  Once peritoneal entry had been achieved, insufflation was started and pneumoperitoneum established at a pressure of . General inspection of the abdomen revealed the above noted findings.   Pneumoperitoneum was evacuated.  The trocars were removed.  All trocar sites were then dressed with surgical skin glue.  The Hulka tenaculum was removed.  Sponge needle and instrument counts were correct time two.  The patient tolerated the procedure well and was taken to the recovery room in stable condition.

## 2019-03-20 NOTE — Anesthesia Postprocedure Evaluation (Signed)
Anesthesia Post Note  Patient: Olivia Thomas  Procedure(s) Performed: LAPAROSCOPY DIAGNOSTIC (N/A )  Patient location during evaluation: PACU Anesthesia Type: General Level of consciousness: awake and alert Pain management: pain level controlled Vital Signs Assessment: post-procedure vital signs reviewed and stable Respiratory status: spontaneous breathing, nonlabored ventilation and respiratory function stable Cardiovascular status: blood pressure returned to baseline and stable Postop Assessment: no apparent nausea or vomiting Anesthetic complications: no     Last Vitals:  Vitals:   03/13/19 1058 03/13/19 1129  BP: 121/68 114/67  Pulse:  98  Resp:  18  Temp:    SpO2:  99%    Last Pain:  Vitals:   03/13/19 1129  TempSrc:   PainSc: 3                  Jenyfer Trawick Garry Heater

## 2019-03-21 ENCOUNTER — Telehealth: Payer: Self-pay | Admitting: Obstetrics and Gynecology

## 2019-03-21 ENCOUNTER — Telehealth (INDEPENDENT_AMBULATORY_CARE_PROVIDER_SITE_OTHER): Payer: BC Managed Care – PPO | Admitting: Obstetrics and Gynecology

## 2019-03-21 ENCOUNTER — Ambulatory Visit: Payer: BC Managed Care – PPO | Admitting: Obstetrics and Gynecology

## 2019-03-21 DIAGNOSIS — Z4889 Encounter for other specified surgical aftercare: Secondary | ICD-10-CM

## 2019-03-21 DIAGNOSIS — N809 Endometriosis, unspecified: Secondary | ICD-10-CM | POA: Insufficient documentation

## 2019-03-21 MED ORDER — NORETHINDRONE ACETATE 5 MG PO TABS: 5.0000 mg | ORAL_TABLET | Freq: Every day | ORAL | 11 refills | Status: DC

## 2019-03-21 NOTE — Telephone Encounter (Signed)
Please advise 

## 2019-03-21 NOTE — Progress Notes (Signed)
    I connected with Olivia [address]t on 03/21/19 at 10:10 AM EST by telephone and verified that I am speaking with the correct person using two identifiers.   I discussed the limitations, risks, security and privacy concerns of performing an evaluation and management service by telephone and the availability of in person appointments. I also discussed with the patient that there may be a patient responsible charge related to this service. The patient expressed understanding and agreed to proceed.  The patient was at home I spoke with the patient from my workstation computer for the video visit The names of people involved in this encounter were: Olivia Thomas , and Vena Austria   Postoperative Follow-up Patient presents post op from diagnostic laparoscopy 1weeks ago for pelvic pain.  Subjective: Patient reports marked improvement in her preop symptoms. Eating a regular diet without difficulty. Pain is controlled without any medications.  Activity: normal activities of daily living.  Objective: Last menstrual period 03/03/2019.  No physical exam as this was a remote telephone visit to promote social distancing during the current COVID-19 Pandemic   Admission on 03/13/2019, Discharged on 03/13/2019  Component Date Value Ref Range Status  . Preg Test, Ur 03/13/2019 NEGATIVE  NEGATIVE Final   Comment:        THE SENSITIVITY OF THIS METHODOLOGY IS >24 mIU/mL     Assessment: 24 y.o. s/p diagnostic laparoscopy stable  Plan: Patient has done well after surgery with no apparent complications.  I have discussed the post-operative course to date, and the expected progress moving forward.  The patient understands what complications to be concerned about.  I will see the patient in routine follow up, or sooner if needed.    - start norethindrone 5mg  po daily.  Discussed continuous OCP, Orilissa, and Mirena as alternatives.    Activity plan: No restriction.  Video Time 9:52  minutes   , MD, Vena Austria OB/GYN, [facility] 03/21/2019, 10:10 AM

## 2019-03-21 NOTE — Telephone Encounter (Signed)
Pt aware.

## 2019-03-21 NOTE — Telephone Encounter (Signed)
If she feels up to going back at this point there are no restrictions

## 2019-03-21 NOTE — Telephone Encounter (Signed)
Called pt, no answer, [facility]. 

## 2019-03-21 NOTE — Telephone Encounter (Signed)
Patient calling to see when she could go back to work, she forgot to ask during her appt.   CB# [phone]

## 2019-03-24 ENCOUNTER — Ambulatory Visit (INDEPENDENT_AMBULATORY_CARE_PROVIDER_SITE_OTHER): Payer: BC Managed Care – PPO | Admitting: Obstetrics and Gynecology

## 2019-03-24 ENCOUNTER — Other Ambulatory Visit: Payer: Self-pay

## 2019-03-24 ENCOUNTER — Encounter: Payer: Self-pay | Admitting: Obstetrics and Gynecology

## 2019-03-24 VITALS — BP 132/88 | Ht 66.0 in | Wt 182.0 lb

## 2019-03-24 DIAGNOSIS — Z4889 Encounter for other specified surgical aftercare: Secondary | ICD-10-CM

## 2019-03-24 DIAGNOSIS — F901 Attention-deficit hyperactivity disorder, predominantly hyperactive type: Secondary | ICD-10-CM | POA: Diagnosis not present

## 2019-03-24 NOTE — Progress Notes (Signed)
      Postoperative Follow-up Patient presents post op from diagnostic laparoscopy 1weeks ago for pelvic pain, findings consistent with stage I endometriosis.  Subjective: Patient reports marked improvement in her preop symptoms. Eating a regular diet without difficulty. Pain is controlled without any medications.  Activity: normal activities of daily living.  Objective: Blood pressure 132/88, height 5\' 6"  (1.676 m), weight 182 lb (82.6 kg), last menstrual period 03/03/2019.  General: NAD Pulmonary: no increased work of breathing Abdomen: soft, non-tender, non-distended, incision D/C/I Extremities: no edema Neurologic: normal gait    Admission on 03/13/2019, Discharged on 03/13/2019  Component Date Value Ref Range Status  . Preg Test, Ur 03/13/2019 NEGATIVE  NEGATIVE Final   Comment:        THE SENSITIVITY OF THIS METHODOLOGY IS >24 mIU/mL     Assessment: 24 y.o. s/p diagnostic laparoscopy stable  Plan: Patient has done well after surgery with no apparent complications.  I have discussed the post-operative course to date, and the expected progress moving forward.  The patient understands what complications to be concerned about.  I will see the patient in routine follow up, or sooner if needed.    Activity plan: No restriction.  Norethindrone 5mg  po daily for suppression of endometriosis, may discontinue sprintec   22, MD, OB/GYN, [facility] 03/24/2019, 5:03 PM

## 2019-04-02 DIAGNOSIS — F250 Schizoaffective disorder, bipolar type: Secondary | ICD-10-CM | POA: Diagnosis not present

## 2019-04-07 ENCOUNTER — Ambulatory Visit: Payer: BC Managed Care – PPO | Attending: Internal Medicine

## 2019-04-07 DIAGNOSIS — Z20822 Contact with and (suspected) exposure to covid-19: Secondary | ICD-10-CM

## 2019-04-08 LAB — NOVEL CORONAVIRUS, NAA: SARS-CoV-2, NAA: NOT DETECTED

## 2019-04-09 DIAGNOSIS — F250 Schizoaffective disorder, bipolar type: Secondary | ICD-10-CM | POA: Diagnosis not present

## 2019-04-28 ENCOUNTER — Ambulatory Visit: Payer: BC Managed Care – PPO | Admitting: Obstetrics and Gynecology

## 2019-04-30 DIAGNOSIS — F250 Schizoaffective disorder, bipolar type: Secondary | ICD-10-CM | POA: Diagnosis not present

## 2019-05-15 ENCOUNTER — Encounter: Payer: Self-pay | Admitting: Emergency Medicine

## 2019-05-15 ENCOUNTER — Emergency Department: Payer: BC Managed Care – PPO

## 2019-05-15 ENCOUNTER — Emergency Department
Admission: EM | Admit: 2019-05-15 | Discharge: 2019-05-16 | Disposition: A | Discharge status: Home / Self Care | Payer: BC Managed Care – PPO | Attending: Emergency Medicine | Admitting: Emergency Medicine

## 2019-05-15 DIAGNOSIS — F419 Anxiety disorder, unspecified: Secondary | ICD-10-CM | POA: Insufficient documentation

## 2019-05-15 DIAGNOSIS — R000 Tachycardia, unspecified: Secondary | ICD-10-CM | POA: Diagnosis not present

## 2019-05-15 DIAGNOSIS — R0789 Other chest pain: Secondary | ICD-10-CM | POA: Diagnosis not present

## 2019-05-15 DIAGNOSIS — Z79899 Other long term (current) drug therapy: Secondary | ICD-10-CM | POA: Diagnosis not present

## 2019-05-15 DIAGNOSIS — R079 Chest pain, unspecified: Secondary | ICD-10-CM | POA: Diagnosis not present

## 2019-05-15 DIAGNOSIS — F319 Bipolar disorder, unspecified: Secondary | ICD-10-CM | POA: Insufficient documentation

## 2019-05-15 LAB — BASIC METABOLIC PANEL
Anion gap: 6 (ref 5–15)
BUN: 9 mg/dL (ref 6–20)
CO2: 28 mmol/L (ref 22–32)
Calcium: 9.5 mg/dL (ref 8.9–10.3)
Chloride: 105 mmol/L (ref 98–111)
Creatinine, Ser: 0.63 mg/dL (ref 0.44–1.00)
GFR calc Af Amer: >60 mL/min (ref >60)
GFR calc non Af Amer: >60 mL/min (ref >60)
Glucose, Bld: 113 mg/dL — ABNORMAL HIGH (ref 70–99)
Potassium: 4.1 mmol/L (ref 3.5–5.1)
Sodium: 139 mmol/L (ref 135–145)

## 2019-05-15 LAB — CBC
HCT: 41.3 % (ref 36.0–46.0)
Hemoglobin: 13.6 g/dL (ref 12.0–15.0)
MCH: 28.2 pg (ref 26.0–34.0)
MCHC: 32.9 g/dL (ref 30.0–36.0)
MCV: 85.5 fL (ref 80.0–100.0)
Platelets: 322 10*3/uL (ref 150–400)
RBC: 4.83 MIL/uL (ref 3.87–5.11)
RDW: 11.9 % (ref 11.5–15.5)
WBC: 6.3 10*3/uL (ref 4.0–10.5)
nRBC: 0 % (ref 0.0–0.2)

## 2019-05-15 LAB — TROPONIN I (HIGH SENSITIVITY)
Troponin I (High Sensitivity): <2 ng/L (ref <18)
Troponin I (High Sensitivity): <2 ng/L (ref <18)

## 2019-05-15 NOTE — ED Triage Notes (Signed)
Pt c/o central chest pain that started x1 hour ago. Pt sts she has anxiety and took hydralazine without relief. Pt denies cough or fever.

## 2019-05-16 LAB — FIBRIN DERIVATIVES D-DIMER (ARMC ONLY): Fibrin derivatives D-dimer (ARMC): 156.72 ng/mL (FEU) (ref 0.00–499.00)

## 2019-05-16 NOTE — ED Provider Notes (Signed)
[facility] Emergency Department Provider Note  ____________________________________________   First MD Initiated Contact with Patient 05/16/19 0009     (approximate)  I have reviewed the triage vital signs and the nursing notes.   HISTORY  Chief Complaint Chest Pain    HPI Olivia Thomas is a 24 y.o. female with below list of previous medical conditions presents to the emergency department secondary to acute onset of central chest pain 1 hour before arrival.  Patient states that she also had rapid heartbeat as well.  Patient states that she thought it may be secondary to "panic attack and so as a result she took hydroxyzine without any improvement".  Patient states that the pain is reproducible palpation.  Patient has no pain at present.  Patient denies any cough no fever.        Past Medical History:  Diagnosis Date  . Anxiety   . Bipolar 1 disorder, depressed, full remission ([place])   . Depression     Patient Active Problem List   Diagnosis Date Noted  . Endometriosis determined by laparoscopy 03/21/2019  . Supervision of high risk pregnancy, antepartum 12/31/2017  . Bipolar disease during pregnancy ([place]) 12/31/2017  . Obesity affecting pregnancy 12/31/2017  . BMI 30.0-30.9,adult 12/31/2017  . Irregular bleeding 09/28/2014  . Anxiety 09/28/2014    Past Surgical History:  Procedure Laterality Date  . INDUCED ABORTION    . LAPAROSCOPY N/A 03/13/2019   Procedure: LAPAROSCOPY DIAGNOSTIC;  Surgeon: Malachy Mood, MD;  Location: [facility] ORS;  Service: Gynecology;  Laterality: N/A;  . WISDOM TOOTH EXTRACTION      Prior to Admission medications   Medication Sig Start Date End Date Taking? Authorizing Provider  divalproex (DEPAKOTE ER) 250 MG 24 hr tablet Take 250 mg by mouth 3 (three) times daily.  01/09/19   [provider]  hydrOXYzine (ATARAX/VISTARIL) 25 MG tablet Take 25-50 mg by mouth daily.  02/03/19   [provider]   ibuprofen (ADVIL) 600 MG tablet Take 1 tablet (600 mg total) by mouth every 6 (six) hours as needed for moderate pain. 03/13/19   Malachy Mood, MD  norethindrone (AYGESTIN) 5 MG tablet Take 1 tablet (5 mg total) by mouth daily. 03/21/19   Malachy Mood, MD  risperiDONE (RISPERDAL) 1 MG tablet Take 1 mg by mouth at bedtime. 01/09/19   [provider]    Allergies Kiwi extract  Family History  Problem Relation Age of Onset  . Autoimmune disease Mother   . Anxiety disorder Mother   . Depression Mother   . Osteoarthritis Father   . Depression Father   . Bipolar disorder Maternal Aunt     Social History Social History   Tobacco Use  . Smoking status: Never Smoker  . Smokeless tobacco: Never Used  Substance Use Topics  . Alcohol use: Yes    Comment: rarely  . Drug use: No    Review of Systems Constitutional: No fever/chills Eyes: No visual changes. ENT: No sore throat. Cardiovascular: Positive for chest pain. Respiratory: Denies shortness of breath. Gastrointestinal: No abdominal pain.  No nausea, no vomiting.  No diarrhea.  No constipation. Genitourinary: Negative for dysuria. Musculoskeletal: Negative for neck pain.  Negative for back pain. Integumentary: Negative for rash. Neurological: Negative for headaches, focal weakness or numbness.   ____________________________________________   PHYSICAL EXAM:  VITAL SIGNS: ED Triage Vitals  Enc Vitals Group     BP 05/15/19 2018 (!) 138/92     Pulse Rate 05/15/19 2018 Marland Kitchen)  109     Resp 05/15/19 2018 18     Temp 05/15/19 2018 98 F (36.7 C)     Temp Source 05/15/19 2018 Oral     SpO2 05/15/19 2018 100 %     Weight 05/15/19 2018 81.6 kg (180 lb)     Height 05/15/19 2018 1.676 m (5\' 6" )     Head Circumference --      Peak Flow --      Pain Score 05/16/19 0145 0     Pain Loc --      Pain Edu? --      Excl. in GC? --     Constitutional: Alert and oriented.  Eyes: Conjunctivae are normal.  Head:  Atraumatic. Mouth/Throat: Patient is wearing a mask. Neck: No stridor.  No meningeal signs.   Cardiovascular: Normal rate, regular rhythm. Good peripheral circulation. Grossly normal heart sounds. Respiratory: Normal respiratory effort.  No retractions. Gastrointestinal: Soft and nontender. No distention.  Musculoskeletal: No lower extremity tenderness nor edema. No gross deformities of extremities. Neurologic:  Normal speech and language. No gross focal neurologic deficits are appreciated.  Skin:  Skin is warm, dry and intact. Psychiatric: Mood and affect are normal. Speech and behavior are normal.  ____________________________________________   LABS (all labs ordered are listed, but only abnormal results are displayed)  Labs Reviewed  BASIC METABOLIC PANEL - Abnormal; Notable for the following components:      Result Value   Glucose, Bld 113 (*)    All other components within normal limits  CBC  FIBRIN DERIVATIVES D-DIMER ([facility] ONLY)  POC URINE PREG, ED  TROPONIN I (HIGH SENSITIVITY)  TROPONIN I (HIGH SENSITIVITY)   ____________________________________________  EKG  ED ECG REPORT I, [place] N Natalia Wittmeyer, the attending physician, personally viewed and interpreted this ECG.   Date: 05/15/2019  EKG Time: 8:27 PM  Rate: 103  Rhythm: Sinus tachycardia  Axis: Normal  Intervals: Normal  ST&T Change: None ____________________________________________  RADIOLOGY I, [place] N Delvon Chipps, personally viewed and evaluated these images (plain radiographs) as part of my medical decision making, as well as reviewing the written report by the radiologist.  ED MD interpretation: No acute cardiopulmonary abnormality noted on chest x-ray per radiologist.  Official radiology report(s): DG Chest 2 View  Result Date: 05/15/2019 CLINICAL DATA:  Chest pain for 1 hour EXAM: CHEST - 2 VIEW COMPARISON:  None. FINDINGS: No consolidation, features of edema, pneumothorax, or effusion. Pulmonary  vascularity is normally distributed. The cardiomediastinal contours are unremarkable. No acute osseous or soft tissue abnormality. IMPRESSION: No acute cardiopulmonary abnormality. Electronically Signed   By: 07/15/2019 M.D.   On: 05/15/2019 20:36    ______________ Procedures   ____________________________________________   INITIAL IMPRESSION / MDM / ASSESSMENT AND PLAN / ED COURSE  As part of my medical decision making, I reviewed the following data within the electronic MEDICAL RECORD NUMBER  23 year old female presented with above-stated history and physical exam differential diagnosis including but not limited to chest wall discomfort panic disorder considered ACS and pulmonary emboli.  EKG revealed no evidence of ischemia or infarction, laboratory data including troponin x2 - as well as D-dimer.  Patient pain-free at present and as such will be discharged home  ____________________________________________  FINAL CLINICAL IMPRESSION(S) / ED DIAGNOSES  Final diagnoses:  Chest wall pain  Atypical chest pain     MEDICATIONS GIVEN DURING THIS VISIT:  Medications - No data to display   ED Discharge Orders    None      *  Please note:  Lizzet Hendley Boyack was evaluated in Emergency Department on 05/16/2019 for the symptoms described in the history of present illness. She was evaluated in the context of the global COVID-19 pandemic, which necessitated consideration that the patient might be at risk for infection with the SARS-CoV-2 virus that causes COVID-19. Institutional protocols and algorithms that pertain to the evaluation of patients at risk for COVID-19 are in a state of rapid change based on information released by regulatory bodies including the CDC and federal and state organizations. These policies and algorithms were followed during the patient's care in the ED.  Some ED evaluations and interventions may be delayed as a result of limited staffing during the pandemic.*  Note:  This  document was prepared using Dragon voice recognition software and may include unintentional dictation errors.   Darci Current, MD 05/16/19 [phone]

## 2019-05-19 ENCOUNTER — Ambulatory Visit (INDEPENDENT_AMBULATORY_CARE_PROVIDER_SITE_OTHER): Payer: BC Managed Care – PPO | Admitting: Physician Assistant

## 2019-05-19 DIAGNOSIS — J039 Acute tonsillitis, unspecified: Secondary | ICD-10-CM | POA: Diagnosis not present

## 2019-05-19 MED ORDER — AMOXICILLIN-POT CLAVULANATE 875-125 MG PO TABS: 1.0000 | ORAL_TABLET | Freq: Two times a day (BID) | ORAL | 0 refills | Status: AC

## 2019-05-19 MED ORDER — PREDNISONE 10 MG PO TABS: ORAL_TABLET | ORAL | 0 refills | Status: DC

## 2019-05-19 NOTE — Patient Instructions (Signed)
Peritonsillar Abscess A peritonsillar abscess is an infected area in your throat that is filled with pus. It forms behind your tonsils. This may be treated by:  Draining the pus. Your doctor may do this with a syringe and a needle (needle aspiration) or by making a cut in the abscess.  Using antibiotic medicine. Follow these instructions at home: Medicines  Take over-the-counter and prescription medicines only as told by your doctor.  If you were prescribed an antibiotic, take it as told by your doctor. Do not stop taking the antibiotic even if you start to feel better. Eating and drinking   Drink enough fluid to keep your pee (urine) pale yellow.  While your throat is sore, try one of these: ? Only drinking liquids. ? Eating only soft foods, such as yogurt and ice cream. General instructions  Rest as much as you can. Get plenty of sleep.  Return to your normal activities as told by your doctor. Ask your doctor what activities are safe for you.  If your abscess was drained, gargle with a salt-water mixture 3-4 times a day or as needed. ? To make a salt-water mixture, completely dissolve -1 tsp of salt in 1 cup of warm water. ? Do not swallow this mixture.  Do not use any products that have nicotine or tobacco in them. These include cigarettes and e-cigarettes. If you need help quitting, ask your doctor.  Keep all follow-up visits as told by your doctor. This is important. Contact a doctor if you have:  More pain, swelling, redness, or pus in your throat.  A headache.  Low energy (lethargy).  A general feeling of illness (malaise).  A fever.  Dizziness.  Trouble swallowing.  Trouble eating.  Signs of body fluid loss (dehydration), such as: ? Feeling light-headed when you are standing. ? Peeing (urinating) less than usual. ? A fast heart rate. ? Dry mouth. Get help right away if you:  Have trouble talking.  Have trouble breathing.  Breathing is easier when  you lean forward.  Cough up blood.  Throw up (vomit) blood.  Have very bad throat pain and it does not get better with medicine. Summary  A peritonsillar abscess is an infected area in your throat that is filled with pus.  You may be treated by having the abscess drained and by taking antibiotic medicine.  Contact a doctor if you have trouble swallowing or eating.  Get help right away if you cough up blood or see blood when you throw up (vomit). This information is not intended to replace advice given to you by your health care provider. Make sure you discuss any questions you have with your health care provider. Document Revised: 02/09/2017 Document Reviewed: 01/09/2017 Elsevier Patient Education  2020 Elsevier Inc.  

## 2019-05-19 NOTE — Progress Notes (Signed)
Patient: Olivia Thomas Adult    DOB: 10-06-1995   24 y.o.   MRN: [id number] Visit Date: 05/19/2019  Today's Provider: Trey Sailors, PA-C   Chief Complaint  Patient presents with  . Sore Throat   Subjective:    Virtual Visit via Telephone Note  I connected with Olivia Thomas on 05/19/19 at  2:00 PM EST by telephone and verified that I am speaking with the correct person using two identifiers.  Location: Patient: Home Provider: Office   I discussed the limitations, risks, security and privacy concerns of performing an evaluation and management service by telephone and the availability of in person appointments. I also discussed with the patient that there may be a patient responsible charge related to this service. The patient expressed understanding and agreed to proceed.  Sore Throat  This is a new problem. The current episode started in the past 7 days. The problem has been gradually worsening. There has been no fever. The fever has been present for less than 1 day. The pain is at a severity of 5/10. The pain is moderate. Associated symptoms include swollen glands and trouble swallowing. Pertinent negatives include no congestion, coughing, headaches, hoarse voice or shortness of breath. Olivia Thomas "Vonna Kotyk" has tried NSAIDs for the symptoms. The treatment provided mild relief.   Patient reports she has a lump on her left tonsil that has been present for at least one month. She reports that in the past two days it has gotten worse and sore. She denies inability to swallow or talk. She denies drooling. She denies fever. She reports a sore throat. She reports that sometimes the mass will touch her uvula and cause a choking sensation but this doesn't happen all the time. She went to the ER on 05/15/2019 for unrelated chest pain and had the ER physician look at it then. She reports she was told it didn't look good.   Allergies  Allergen Reactions  . Kiwi Extract Swelling   oral     Current Outpatient Medications:  .  amoxicillin-clavulanate (AUGMENTIN) 875-125 MG tablet, Take 1 tablet by mouth 2 (two) times daily for 10 days., Disp: 20 tablet, Rfl: 0 .  divalproex (DEPAKOTE ER) 250 MG 24 hr tablet, Take 250 mg by mouth 3 (three) times daily. , Disp: , Rfl:  .  hydrOXYzine (ATARAX/VISTARIL) 25 MG tablet, Take 25-50 mg by mouth daily. , Disp: , Rfl:  .  ibuprofen (ADVIL) 600 MG tablet, Take 1 tablet (600 mg total) by mouth every 6 (six) hours as needed for moderate pain., Disp: 40 tablet, Rfl: 0 .  norethindrone (AYGESTIN) 5 MG tablet, Take 1 tablet (5 mg total) by mouth daily., Disp: 30 tablet, Rfl: 11 .  predniSONE (DELTASONE) 10 MG tablet, Take 6 tablets on day 1, take 5 pills on day 2, and so on until complete., Disp: 21 tablet, Rfl: 0 .  risperiDONE (RISPERDAL) 1 MG tablet, Take 1 mg by mouth at bedtime., Disp: , Rfl:   Review of Systems  Constitutional: Positive for chills. Negative for fatigue and fever.  HENT: Positive for sore throat and trouble swallowing. Negative for congestion, hoarse voice, sinus pressure, sinus pain and voice change.   Respiratory: Negative for cough and shortness of breath.   Neurological: Negative for dizziness and headaches.    Social History   Tobacco Use  . Smoking status: Never Smoker  . Smokeless tobacco: Never Used  Substance Use Topics  .  Alcohol use: Yes    Comment: rarely      Objective:   There were no vitals taken for this visit. There were no vitals filed for this visit.There is no height or weight on file to calculate BMI.   Physical Exam   No results found for any visits on 05/19/19.     Assessment & Plan    1. Tonsillitis  I am unable to visualize tonsils through telephone visit. Instructed patient if she is able to take a photo and send it through mychart this would be helpful for assessment: DDx: viral tonsillitis, strep throat, peritonsillar abscess, malignancy. Counseled patient I will  treat for infection and potential abscess. The patient is talking to me on the phone without issue, variations in her voice, or trouble breathing. The mass has been there 1+ month. Will place referral for ENT. Counseled that if medication is not effective and/or she has persistent fever, trouble swallowing, breathing or voice change she will need to be seen in the ER.   - amoxicillin-clavulanate (AUGMENTIN) 875-125 MG tablet; Take 1 tablet by mouth 2 (two) times daily for 10 days.  Dispense: 20 tablet; Refill: 0 - predniSONE (DELTASONE) 10 MG tablet; Take 6 tablets on day 1, take 5 pills on day 2, and so on until complete.  Dispense: 21 tablet; Refill: 0 - Ambulatory referral to ENT   I discussed the assessment and treatment plan with the patient. The patient was provided an opportunity to ask questions and all were answered. The patient agreed with the plan and demonstrated an understanding of the instructions.   The patient was advised to call back or seek an in-person evaluation if the symptoms worsen or if the condition fails to improve as anticipated.  I provided 25 minutes of non-face-to-face time during this encounter.     Trinna Post, PA-C  [facility]

## 2019-06-05 DIAGNOSIS — Z23 Encounter for immunization: Secondary | ICD-10-CM | POA: Diagnosis not present

## 2019-06-20 DIAGNOSIS — J358 Other chronic diseases of tonsils and adenoids: Secondary | ICD-10-CM | POA: Diagnosis not present

## 2019-06-20 DIAGNOSIS — R070 Pain in throat: Secondary | ICD-10-CM | POA: Diagnosis not present

## 2019-06-20 DIAGNOSIS — J351 Hypertrophy of tonsils: Secondary | ICD-10-CM | POA: Diagnosis not present

## 2019-06-25 ENCOUNTER — Encounter: Payer: Self-pay | Admitting: Physician Assistant

## 2019-06-25 ENCOUNTER — Other Ambulatory Visit: Payer: Self-pay

## 2019-06-25 ENCOUNTER — Other Ambulatory Visit (HOSPITAL_COMMUNITY)
Admission: RE | Admit: 2019-06-25 | Discharge: 2019-06-25 | Disposition: A | Discharge status: Home / Self Care | Payer: BC Managed Care – PPO | Source: Ambulatory Visit | Attending: Physician Assistant | Admitting: Physician Assistant

## 2019-06-25 ENCOUNTER — Ambulatory Visit (INDEPENDENT_AMBULATORY_CARE_PROVIDER_SITE_OTHER): Payer: BC Managed Care – PPO | Admitting: Physician Assistant

## 2019-06-25 VITALS — BP 118/72 | HR 117 | Temp 97.1°F | Wt 191.4 lb

## 2019-06-25 DIAGNOSIS — R12 Heartburn: Secondary | ICD-10-CM

## 2019-06-25 DIAGNOSIS — Z113 Encounter for screening for infections with a predominantly sexual mode of transmission: Secondary | ICD-10-CM | POA: Diagnosis not present

## 2019-06-25 DIAGNOSIS — F250 Schizoaffective disorder, bipolar type: Secondary | ICD-10-CM

## 2019-06-25 NOTE — Progress Notes (Signed)
Established patient visit     Patient: Olivia Thomas   DOB: [birth year]   24 y.o. Adult  MRN: [id number] Visit Date: 06/25/2019  Today's healthcare provider: Trey Sailors, PA-C  Subjective:    Alton Revere McClurkin,acting as a scribe for Trey Sailors, PA-C.,have documented all relevant documentation on the behalf of Trey Sailors, PA-C,as directed by  Trey Sailors, PA-C while in the presence of Trey Sailors, PA-C.  Chief Complaint  Patient presents with  . Heartburns   Gastroesophageal Reflux Olivia Thomas "Olivia Thomas" complains of belching, choking, coughing, heartburn and nausea. Olivia Thomas "Olivia Thomas" reports no abdominal pain or no chest pain. This is a new problem. The current episode started more than 1 month ago. The problem occurs constantly. The problem has been unchanged. The heartburn wakes Olivia Thomas" from sleep. The heartburn does not limit Olivia Thomas "Olivia Thomas"'s activity. The symptoms are aggravated by lying down and certain foods. Risk factors include NSAIDs and obesity. Asjah A Corsi "Olivia Thomas" has tried an antacid for the symptoms. The treatment provided mild relief.  Patient states her boyfriend informed her she cough while sleeping and wakes up out of her sleep from aspiration. She reports that her throat sometimes burns and she feels like she is drown. .pcph    Medications: Outpatient Medications Prior to Visit  Medication Sig  . divalproex (DEPAKOTE ER) 250 MG 24 hr tablet Take 250 mg by mouth 3 (three) times daily.   . hydrOXYzine (ATARAX/VISTARIL) 25 MG tablet Take 25-50 mg by mouth daily.   Marland Kitchen ibuprofen (ADVIL) 600 MG tablet Take 1 tablet (600 mg total) by mouth every 6 (six) hours as needed for moderate pain.  Marland Kitchen norethindrone (AYGESTIN) 5 MG tablet Take 1 tablet (5 mg total) by mouth daily.  . predniSONE (DELTASONE) 10 MG tablet Take 6 tablets on day 1, take 5 pills on day 2, and so on until complete.  . risperiDONE (RISPERDAL) 1 MG  tablet Take 1 mg by mouth at bedtime.  . SPRINTEC 28 0.25-35 MG-MCG tablet Take 1 tablet by mouth daily.  . traZODone (DESYREL) 50 MG tablet Take 50 mg by mouth at bedtime as needed. for sleep   No facility-administered medications prior to visit.    Review of Systems  Respiratory: Positive for cough and choking.   Cardiovascular: Negative for chest pain.  Gastrointestinal: Positive for heartburn and nausea. Negative for abdominal pain.        Objective:    BP 118/72 (BP Location: Right Arm, Patient Position: Sitting, Cuff Size: Large)   Pulse (!) 117   Temp (!) 97.1 F (36.2 C) (Temporal)   Wt 191 lb 6.4 oz (86.8 kg)   SpO2 98%   BMI 30.89 kg/m    Physical Exam Cardiovascular:     Rate and Rhythm: Normal rate and regular rhythm.     Pulses: Normal pulses.     Heart sounds: Normal heart sounds.  Pulmonary:     Effort: Pulmonary effort is normal.     Breath sounds: Normal breath sounds.  Abdominal:     General: Bowel sounds are normal. There is no distension.     Tenderness: There is no abdominal tenderness.     Hernia: No hernia is present.  Skin:    General: Skin is warm and dry.  Neurological:     General: No focal deficit present.     Mental Status: Suella Cogar Mcmanaman "Olivia Thomas" is oriented to  person, place, and time.  Psychiatric:        Mood and Affect: Mood normal.        Behavior: Behavior normal.       No results found for any visits on 06/25/19.    Assessment & Plan:    1. Schizoaffective disorder, bipolar type ([place]) Follow by psychiatry.  2. Heartburn Counseled on diet and lifestyle interventions  - H. pylori breath test If H.pylori with antibiotics and after test today can started Pepcid 20 mg twice daily 30 mintues before meals.  3.STI screening Urine cytology  Return if symptoms worsen or fail to improve.      Paulene Floor  [facility] [phone] (phone) [phone] (fax)  [place]

## 2019-06-25 NOTE — Patient Instructions (Signed)
Started taking Pepcid 20 mg twice a day 30 minutes before meals.

## 2019-06-26 DIAGNOSIS — Z23 Encounter for immunization: Secondary | ICD-10-CM | POA: Diagnosis not present

## 2019-06-26 LAB — H. PYLORI BREATH TEST: H pylori Breath Test: NEGATIVE

## 2019-06-27 LAB — URINE CYTOLOGY ANCILLARY ONLY
Chlamydia: NEGATIVE
Comment: NEGATIVE
Comment: NEGATIVE
Comment: NORMAL
Neisseria Gonorrhea: NEGATIVE
Trichomonas: NEGATIVE

## 2019-09-23 IMAGING — CR DG ANKLE COMPLETE 3+V*L*
3 series · 3 of 3 positions shown · non-contrast
Comparison: 06/09/2010

CLINICAL DATA: Left ankle pain after stepping into a hole last
evening and hearing a pop. Patient is noted to be pregnant and was
double shielded prior to acquiring these radiographs.

EXAM:
LEFT ANKLE COMPLETE - 3+ VIEW

[ankle ap]
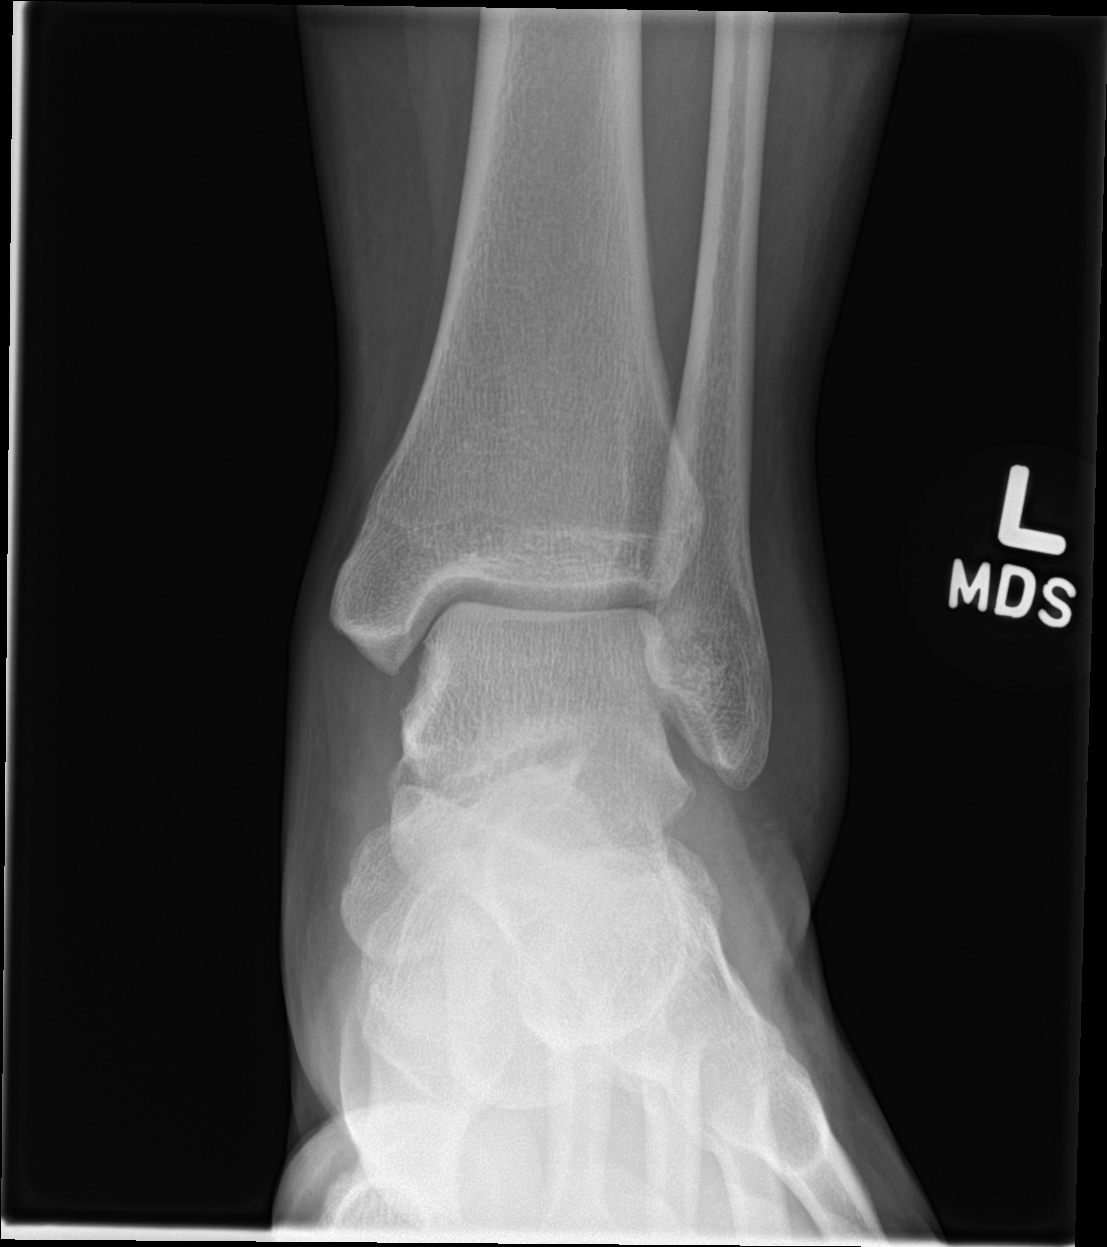

[ankle obl]
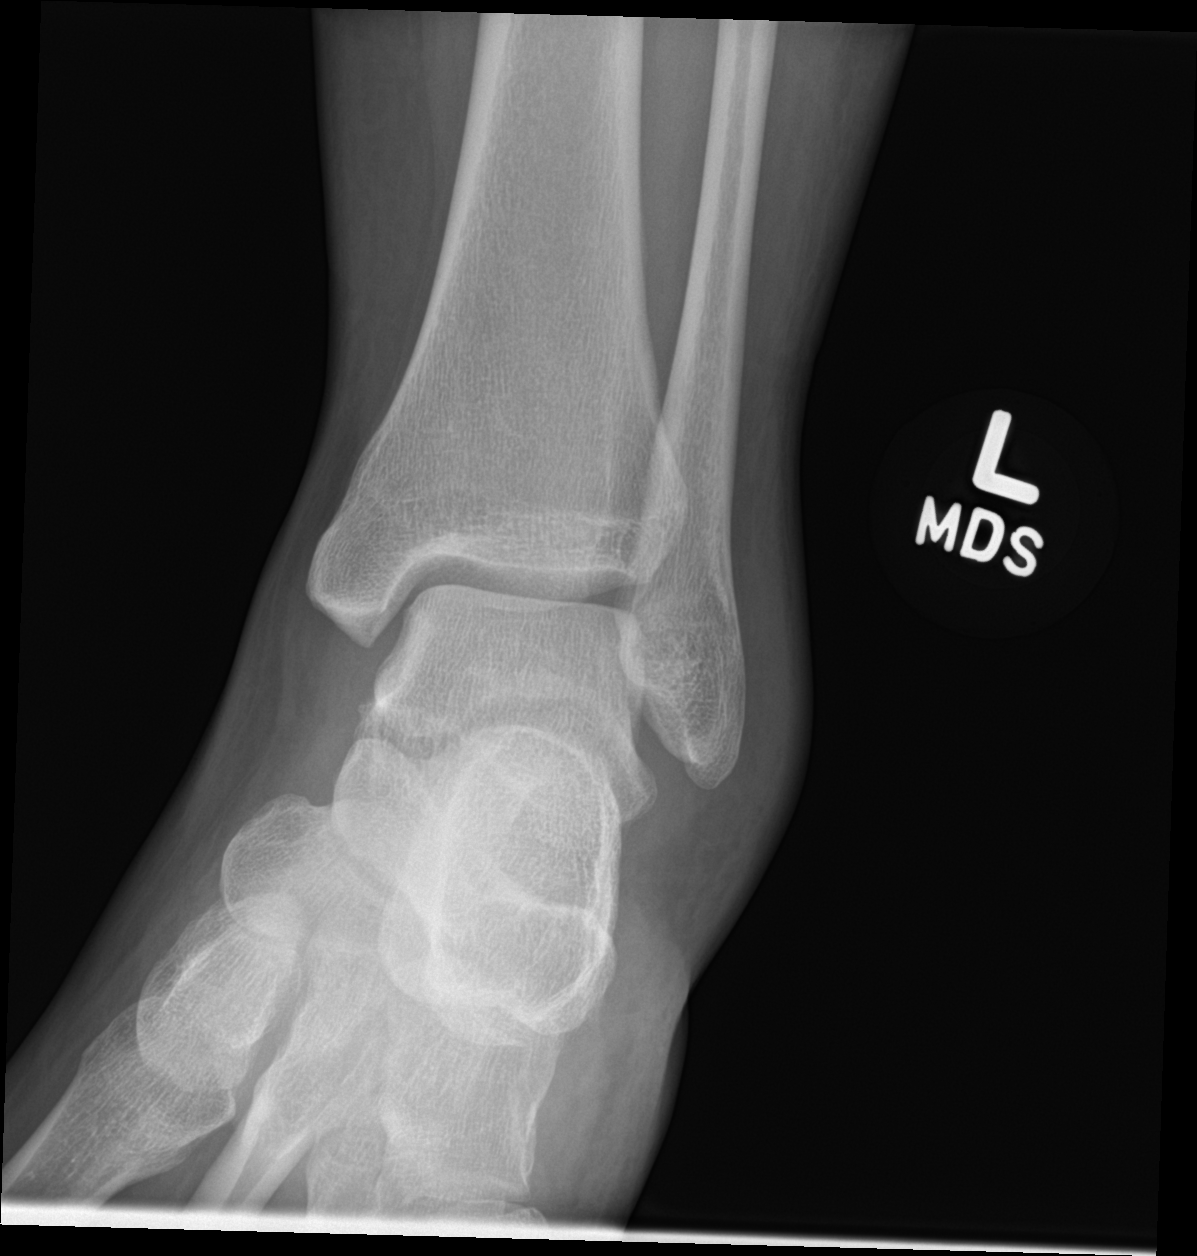

[ankle lat]
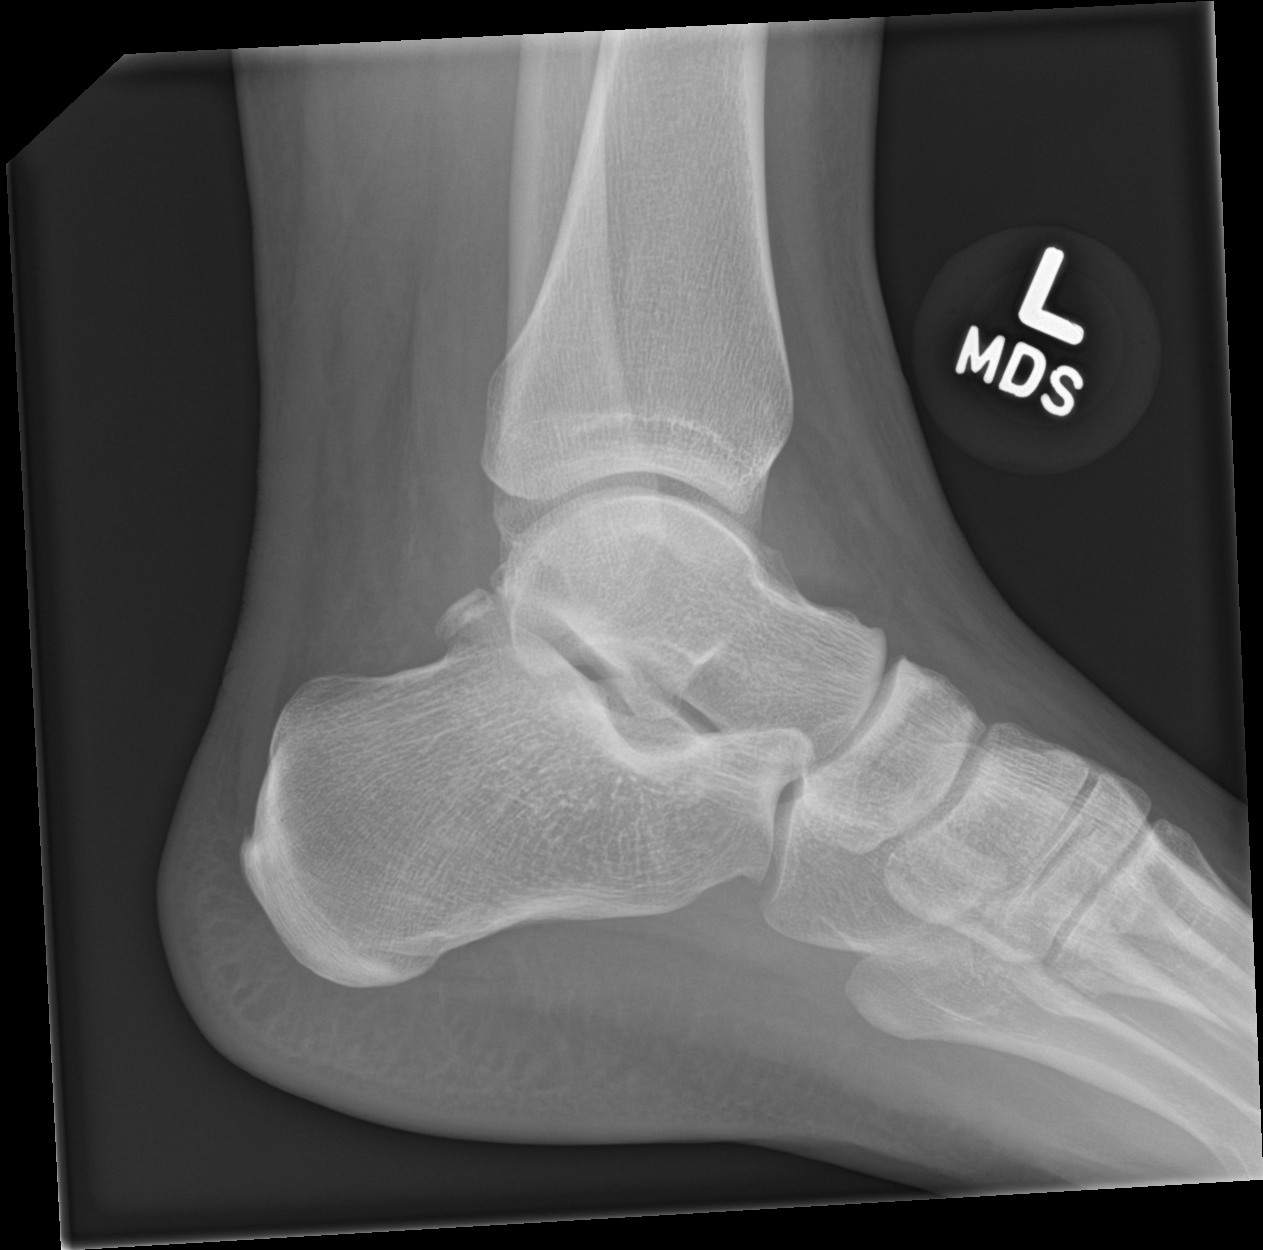

[3 of 3 positions shown; findings below may reference images not displayed]

FINDINGS: Soft tissue swelling over the lateral malleolus. Small ankle joint
effusion. No acute displaced fracture. Base of fifth metatarsal
appears intact. The tibiotalar, subtalar and midfoot articulations
appear congruent.
IMPRESSION: Soft tissue swelling about the malleoli, more so laterally with
small ankle joint effusion. No fracture or joint dislocation.

## 2020-08-24 ENCOUNTER — Telehealth: Payer: Self-pay | Admitting: Obstetrics and Gynecology

## 2020-08-24 ENCOUNTER — Encounter: Payer: Self-pay | Admitting: Obstetrics and Gynecology

## 2020-08-24 ENCOUNTER — Other Ambulatory Visit: Payer: Self-pay

## 2020-08-24 ENCOUNTER — Ambulatory Visit: Payer: Self-pay | Admitting: Obstetrics and Gynecology

## 2020-08-24 VITALS — BP 112/80 | HR 102 | Resp 16 | Wt 180.2 lb

## 2020-08-24 DIAGNOSIS — Z8759 Personal history of other complications of pregnancy, childbirth and the puerperium: Secondary | ICD-10-CM

## 2020-08-24 DIAGNOSIS — Z3200 Encounter for pregnancy test, result unknown: Secondary | ICD-10-CM

## 2020-08-24 DIAGNOSIS — Z8659 Personal history of other mental and behavioral disorders: Secondary | ICD-10-CM

## 2020-08-24 DIAGNOSIS — Z8742 Personal history of other diseases of the female genital tract: Secondary | ICD-10-CM

## 2020-08-24 LAB — POCT URINE PREGNANCY: Preg Test, Ur: POSITIVE — AB

## 2020-08-24 NOTE — Progress Notes (Signed)
HPI:      Ms. Mairead Schwarzkopf Bewley is a 25 y.o. G2P0010 who LMP was No LMP recorded (lmp unknown). Patient is pregnant.  Subjective:   She presents today for possible pregnancy.  She has a history of PCO and endometriosis and was not preventing pregnancy.  She has not had a menstrual period in many months.  She is surprised that she is pregnant.  She is taking prenatal vitamins.  She has nausea but is not vomiting. She has a significant medical history which reveals history of schizophrenia and bipolar disorder currently on Depakote. Her first pregnancy ended in elective termination and the patient had significant postpartum depression following. She has some concern regarding postpartum depression with this baby.  She is also concerned regarding future bonding with this baby.    Hx: The following portions of the patient's history were reviewed and updated as appropriate:             Lasheba A Caruth "Vonna Kotyk"  has a past medical history of Anxiety, Bipolar 1 disorder, depressed, full remission (HCC), and Depression. Judianne A Symes "Vonna Kotyk" does not have any pertinent problems on file. Ginette Pitman Posten "Vonna Kotyk"  has a past surgical history that includes Wisdom tooth extraction; Induced abortion; and laparoscopy (N/A, 03/13/2019). [name] A Helminiak "Jay"'s family history includes Anxiety disorder in [place] A. Tomer "Jay"'s mother; Autoimmune disease in [place] A. Eyerman "Jay"'s mother; Bipolar disorder in [place] A. Schloss "Jay"'s maternal aunt; Depression in [place] A. Slagter "Jay"'s father and mother; Osteoarthritis in [place] A. Pacifico "Jay"'s father. Ginette Pitman Domke "Vonna Kotyk"  reports that [employer]. Wake "Vonna Kotyk" has never smoked. Synia A. Wunder "Vonna Kotyk" has never used smokeless tobacco. [name] A. Holleman "Vonna Kotyk" reports current alcohol use. Magdalene A. Hochberg "Vonna Kotyk" reports that [employer]. Hockman "Vonna Kotyk" does not use drugs. Ginette Pitman Eckhart "Vonna Kotyk" has a current medication list which includes the following  prescription(s): divalproex, hydroxyzine, ibuprofen, norethindrone, prednisone, risperidone, sprintec 28, and trazodone. [name] A Luty "Vonna Kotyk" is allergic to kiwi extract.       Review of Systems:  Review of Systems  Constitutional: Denied constitutional symptoms, night sweats, recent illness, fatigue, fever, insomnia and weight loss.  Eyes: Denied eye symptoms, eye pain, photophobia, vision change and visual disturbance.  Ears/Nose/Throat/Neck: Denied ear, nose, throat or neck symptoms, hearing loss, nasal discharge, sinus congestion and sore throat.  Cardiovascular: Denied cardiovascular symptoms, arrhythmia, chest pain/pressure, edema, exercise intolerance, orthopnea and palpitations.  Respiratory: Denied pulmonary symptoms, asthma, pleuritic pain, productive sputum, cough, dyspnea and wheezing.  Gastrointestinal: Denied, gastro-esophageal reflux, melena, nausea and vomiting.  Genitourinary: Denied genitourinary symptoms including symptomatic vaginal discharge, pelvic relaxation issues, and urinary complaints.  Musculoskeletal: Denied musculoskeletal symptoms, stiffness, swelling, muscle weakness and myalgia.  Dermatologic: Denied dermatology symptoms, rash and scar.  Neurologic: Denied neurology symptoms, dizziness, headache, neck pain and syncope.  Psychiatric: Denied psychiatric symptoms, anxiety and depression.  Endocrine: Denied endocrine symptoms including hot flashes and night sweats.   Meds:   Current Outpatient Medications on File Prior to Visit  Medication Sig Dispense Refill   divalproex (DEPAKOTE ER) 250 MG 24 hr tablet Take 250 mg by mouth 3 (three) times daily.      hydrOXYzine (ATARAX/VISTARIL) 25 MG tablet Take 25-50 mg by mouth daily.      ibuprofen (ADVIL) 600 MG tablet Take 1 tablet (600 mg total) by mouth every 6 (six) hours as needed for moderate pain. 40 tablet 0   norethindrone (AYGESTIN) 5 MG tablet Take 1 tablet (5  mg total) by mouth daily. 30 tablet 11    predniSONE (DELTASONE) 10 MG tablet Take 6 tablets on day 1, take 5 pills on day 2, and so on until complete. 21 tablet 0   risperiDONE (RISPERDAL) 1 MG tablet Take 1 mg by mouth at bedtime.     SPRINTEC 28 0.25-35 MG-MCG tablet Take 1 tablet by mouth daily.     traZODone (DESYREL) 50 MG tablet Take 50 mg by mouth at bedtime as needed. for sleep     No current facility-administered medications on file prior to visit.        The pregnancy intention screening data noted above was reviewed. Potential methods of contraception were discussed. The patient elected to proceed with Pregnant/Seeking Pregnancy.     Objective:     Vitals:   08/24/20 1131  BP: 112/80  Pulse: (!) 102  Resp: 16   Filed Weights   08/24/20 1131  Weight: 180 lb 3.2 oz (81.7 kg)              Urinary beta-hCG positive  Assessment:    G2P0010 Patient Active Problem List   Diagnosis Date Noted   Schizoaffective disorder, bipolar type (HCC) 06/25/2019   Endometriosis determined by laparoscopy 03/21/2019   Supervision of high risk pregnancy, antepartum 12/31/2017   Bipolar disease during pregnancy (HCC) 12/31/2017   Obesity affecting pregnancy 12/31/2017   BMI 30.0-30.9,adult 12/31/2017   Irregular bleeding 09/28/2014   Anxiety 09/28/2014     1. Possible pregnancy, not yet confirmed   2. History of bipolar disorder   3. History of schizoaffective disorder   4. History of postpartum depression   5. History of PCOS     Unknown dating because of PCO.  Patient with concerns regarding bonding with her baby and breast-feeding.     Plan:            Prenatal Plan 1.  The patient was given prenatal literature. 2.  She was continued on prenatal vitamins. 3.  A prenatal lab panel to be drawn at nurse visit. 4.  An ultrasound was ordered to better determine an EDC. 5.  A nurse visit was scheduled. 6.  Genetic testing and testing for other inheritable conditions discussed in detail. She will decide in the  future whether to have these labs performed. 7.  A general overview of pregnancy testing, visit schedule, ultrasound schedule, and prenatal care was discussed. 8.  COVID and its risks associated with pregnancy, prevention by limiting exposure and use of masks, as well as the risks and benefits of vaccination during pregnancy were discussed in detail.  [facility] policy regarding office and hospital visitation and testing was explained. 9.  Benefits of breast-feeding discussed in detail including both maternal and infant benefits. Ready Set Baby website discussed. 10.  Advised patient to contact her psychiatric care provider today and discontinue or substitute if possible for Depakote 11.  Literature regarding nausea and vomiting of pregnancy given 12.  Discussed postpartum depression and awareness during this pregnancy and postpartum   Orders Orders Placed This Encounter  Procedures   US OB Comp Less 14 Wks   POCT urine pregnancy    No orders of the defined types were placed in this encounter.     F/U  Return in about 5 weeks (around 09/28/2020).  Elonda Husky, M.D. 08/24/2020 11:53 AM

## 2020-08-26 NOTE — Telephone Encounter (Signed)
error 

## 2020-09-02 ENCOUNTER — Ambulatory Visit
Admission: RE | Admit: 2020-09-02 | Discharge: 2020-09-02 | Disposition: A | Discharge status: Home / Self Care | Payer: BC Managed Care – PPO | Source: Ambulatory Visit | Attending: Obstetrics and Gynecology | Admitting: Obstetrics and Gynecology

## 2020-09-02 ENCOUNTER — Other Ambulatory Visit: Payer: Self-pay | Admitting: Obstetrics and Gynecology

## 2020-09-02 ENCOUNTER — Other Ambulatory Visit: Payer: Self-pay

## 2020-09-02 DIAGNOSIS — Z3200 Encounter for pregnancy test, result unknown: Secondary | ICD-10-CM

## 2020-09-13 ENCOUNTER — Telehealth: Payer: Self-pay | Admitting: Nurse Practitioner

## 2020-09-13 DIAGNOSIS — N3000 Acute cystitis without hematuria: Secondary | ICD-10-CM

## 2020-09-13 MED ORDER — NITROFURANTOIN MONOHYD MACRO 100 MG PO CAPS: 100.0000 mg | ORAL_CAPSULE | Freq: Two times a day (BID) | ORAL | 0 refills | Status: AC

## 2020-09-13 NOTE — Progress Notes (Signed)
E-Visit for Urinary Problems  We are sorry that you are not feeling well.  Here is how we plan to help!  Based on what you shared with me it looks like you most likely have a simple urinary tract infection.  A UTI (Urinary Tract Infection) is a bacterial infection of the bladder.  Most cases of urinary tract infections are simple to treat but a key part of your care is to encourage you to drink plenty of fluids and watch your symptoms carefully.  I have prescribed MacroBid 100 mg twice a day for 5 days.  Your symptoms should gradually improve. Call us if the burning in your urine worsens, you develop worsening fever, back pain or pelvic pain or if your symptoms do not resolve after completing the antibiotic.  Urinary tract infections can be prevented by drinking plenty of water to keep your body hydrated.  Also be sure when you wipe, wipe from front to back and don't hold it in!  If possible, empty your bladder every 4 hours.  HOME CARE Drink plenty of fluids Compete the full course of the antibiotics even if the symptoms resolve Remember, when you need to go.go. Holding in your urine can increase the likelihood of getting a UTI! GET HELP RIGHT AWAY IF: You cannot urinate You get a high fever Worsening back pain occurs You see blood in your urine You feel sick to your stomach or throw up You feel like you are going to pass out  MAKE SURE YOU  Understand these instructions. Will watch your condition. Will get help right away if you are not doing well or get worse.  Meds ordered this encounter  Medications   nitrofurantoin, macrocrystal-monohydrate, (MACROBID) 100 MG capsule    Sig: Take 1 capsule (100 mg total) by mouth 2 (two) times daily for 5 days.    Dispense:  10 capsule    Refill:  0    Thank you for choosing an e-visit.  Your e-visit answers were reviewed by a board certified advanced clinical practitioner to complete your personal care plan. Depending upon the  condition, your plan could have included both over the counter or prescription medications.  Please review your pharmacy choice. Make sure the pharmacy is open so you can pick up prescription now. If there is a problem, you may contact your provider through [employer] and have the prescription routed to another pharmacy.  Your safety is important to [place]. If you have drug allergies check your prescription carefully.   For the next 24 hours you can use MyChart to ask questions about today's visit, request a non-urgent call back, or ask for a work or school excuse. You will get an email in the next two days asking about your experience. I hope that your e-visit has been valuable and will speed your recovery.   I spent approximately 5 minutes reviewing the patient's history, current symptoms and coordinating their plan of care today.

## 2020-09-21 ENCOUNTER — Encounter: Payer: BC Managed Care – PPO | Admitting: Obstetrics and Gynecology

## 2020-09-22 ENCOUNTER — Encounter: Payer: Self-pay | Admitting: Obstetrics and Gynecology

## 2020-09-28 ENCOUNTER — Encounter: Payer: Self-pay | Admitting: Obstetrics and Gynecology

## 2020-11-03 ENCOUNTER — Other Ambulatory Visit: Payer: Self-pay

## 2020-11-03 ENCOUNTER — Ambulatory Visit
Admission: RE | Admit: 2020-11-03 | Discharge: 2020-11-03 | Disposition: A | Discharge status: Home / Self Care | Payer: 59 | Source: Ambulatory Visit | Attending: Emergency Medicine | Admitting: Emergency Medicine

## 2020-11-03 ENCOUNTER — Ambulatory Visit: Payer: Self-pay | Admitting: *Deleted

## 2020-11-03 VITALS — BP 112/79 | HR 99 | Temp 99.2°F | Resp 18

## 2020-11-03 DIAGNOSIS — J029 Acute pharyngitis, unspecified: Secondary | ICD-10-CM

## 2020-11-03 LAB — POCT RAPID STREP A (OFFICE): Rapid Strep A Screen: NEGATIVE

## 2020-11-03 MED ORDER — AMOXICILLIN 500 MG PO TABS: 500.0000 mg | ORAL_TABLET | Freq: Two times a day (BID) | ORAL | 0 refills | Status: AC

## 2020-11-03 NOTE — ED Triage Notes (Signed)
Pt here with sore throat and painful swallowing x 3 days. No fever. No other sx. Prone to Strep throat.

## 2020-11-03 NOTE — ED Provider Notes (Signed)
CHIEF COMPLAINT:   Chief Complaint  Patient presents with   Sore Throat     SUBJECTIVE/HPI:   Sore Throat  A very pleasant 25 y.o.Adult presents today with sore throat and painful swallowing for the last 3 days.  Patient reports that they have a history of strep throat and states that this feels similar.  No coughing, shortness of breath, chest pain, palpitations, visual changes, fever or chills reported.   has a past medical history of Anxiety, Bipolar 1 disorder, depressed, full remission (HCC), and Depression.  ROS:  Review of Systems See Subjective/HPI Medications, Allergies and Problem List personally reviewed in Epic today OBJECTIVE:   Vitals:   11/03/20 1327  BP: 112/79  Pulse: 99  Resp: 18  Temp: 99.2 F (37.3 C)  SpO2: 96%    Physical Exam   General: Appears well-developed and well-nourished. No acute distress.  HEENT Head: Normocephalic and atraumatic.   Ears: Hearing grossly intact, no drainage or visible deformity.  Nose: No nasal deviation.   Mouth/Throat: No stridor or tracheal deviation.  + erythematous posterior pharynx noted with clear drainage present.  + white patchy exudate noted.  2+ tonsillar swelling noted bilaterally. Eyes: Conjunctivae and EOM are normal. No eye drainage or scleral icterus bilaterally.  Neck: Normal range of motion, neck is supple. + Bilateral anterior cervical lymph nodes palpated.  Tender, mobile and soft. Cardiovascular: Normal rate. Regular rhythm; no murmurs, gallops, or rubs.  Pulm/Chest: No respiratory distress. Breath sounds normal bilaterally without wheezes, rhonchi, or rales.  Neurological: Alert and oriented to person, place, and time.  Skin: Skin is warm and dry.  No rashes, lesions, abrasions or bruising noted to skin.   Psychiatric: Normal mood, affect, behavior, and thought content.   Vital signs and nursing note reviewed.   Patient stable and cooperative with examination. PROCEDURES:     LABS/X-RAYS/EKG/MEDS:   Results for orders placed or performed during the hospital encounter of 11/03/20  POCT rapid strep A  Result Value Ref Range   Rapid Strep A Screen Negative Negative   -Strep negative. MEDICAL DECISION MAKING:   Patient presents with sore throat and painful swallowing for the last 3 days.  Patient reports that they have a history of strep throat and states that this feels similar.  No coughing, shortness of breath, chest pain, palpitations, visual changes, fever or chills reported.  Chart review completed.  Strep negative, but given assessment findings and history likely exudative pharyngitis.  Rx'd amoxicillin to the patient's preferred pharmacy to use twice daily for the next 10 days.  Patient reports that they have tolerated this well in the past.  Advised at home treatment and care to include fluids, Tylenol versus ibuprofen, warm salt gargles and to throw away their toothbrush after 2 days of treatment.  Advised to follow-up with PCP in the next 2 to 3 days if symptoms do not improve.  Return for worsening of pain, persistent fever, drooling or neck stiffness.  Patient verbalized understanding and agreed with treatment plan.  Patient stable upon discharge. ASSESSMENT/PLAN:  1. Exudative pharyngitis - amoxicillin (AMOXIL) 500 MG tablet; Take 1 tablet (500 mg total) by mouth 2 (two) times daily for 10 days.  Dispense: 20 tablet; Refill: 0 Instructions about new medications and side effects provided.  Plan:   Discharge Instructions      I recommend the following for at-home care:   Push fluids (water, Gatorade, Pedialyte).   Tylenol or ibuprofen for pain or fever.  Warm salt water  gargles for throat pain as needed.  Throw away toothbrush after 2 days of treatment.   Be sure to complete all of your antibiotic, even if you are feeling better sooner.  Be advised that you will be infectious for 24 hours after starting antibiotics.  Follow up with PCP if not  improving in 2 - 3 days.   Return for significant worsening of pain, persistent fever, drooling, or neck stiffness.           Amalia Greenhouse, [place] 11/03/20 [phone]

## 2020-11-03 NOTE — Discharge Instructions (Addendum)
I recommend the following for at-home care:   Push fluids (water, Gatorade, Pedialyte).   Tylenol or ibuprofen for pain or fever.  Warm salt water gargles for throat pain as needed.  Throw away toothbrush after 2 days of treatment.   Be sure to complete all of your antibiotic, even if you are feeling better sooner.  Be advised that you will be infectious for 24 hours after starting antibiotics.  Follow up with PCP if not improving in 2 - 3 days.   Return for significant worsening of pain, persistent fever, drooling, or neck stiffness.  

## 2020-11-03 NOTE — Telephone Encounter (Signed)
Per agent:  "The patient shares that they've experienced a sore throat for roughly 3 days   The patient shares that they have a previous history of strep throat issues and would like to discuss further when possible   The patient shares that they're noticing white spots in their throat   Please contact further when possible."     Pt reports sore throat x 3 days. Reports white patches, swelling at back of throat. 6/10 discomfort, no fever. States H/O strep throat, "And this is what it feels like." Tested neg for covid this AM. Advised UC. States will follow disposition.   Reason for Disposition  [1] Pus on tonsils (back of throat) AND [2]  fever AND [3] swollen neck lymph nodes ("glands")  Answer Assessment - Initial Assessment Questions 1. ONSET: "When did the throat start hurting?" (Hours or days ago)      3 days ago 2. SEVERITY: "How bad is the sore throat?" (Scale 1-10; mild, moderate or severe)   - MILD (1-3):  doesn't interfere with eating or normal activities   - MODERATE (4-7): interferes with eating some solids and normal activities   - SEVERE (8-10):  excruciating pain, interferes with most normal activities   - SEVERE DYSPHAGIA: can't swallow liquids, drooling     5-6/10 3. STREP EXPOSURE: "Has there been any exposure to strep within the past week?" If Yes, ask: "What type of contact occurred?"      Unsure 4.  VIRAL SYMPTOMS: "Are there any symptoms of a cold, such as a runny nose, cough, hoarse voice or red eyes?"      Some congestion 5. FEVER: "Do you have a fever?" If Yes, ask: "What is your temperature, how was it measured, and when did it start?"     no 6. PUS ON THE TONSILS: "Is there pus on the tonsils in the back of your throat?"     White patches 7. OTHER SYMPTOMS: "Do you have any other symptoms?" (e.g., difficulty breathing, headache, rash)     Nausea  Protocols used: Sore Throat-A-AH

## 2021-02-02 ENCOUNTER — Telehealth: Payer: 59 | Admitting: Family

## 2021-02-02 DIAGNOSIS — B3731 Acute candidiasis of vulva and vagina: Secondary | ICD-10-CM | POA: Diagnosis not present

## 2021-02-02 MED ORDER — FLUCONAZOLE 150 MG PO TABS: 150.0000 mg | ORAL_TABLET | ORAL | 0 refills | Status: DC | PRN

## 2021-02-02 NOTE — Progress Notes (Signed)

## 2021-02-16 IMAGING — CR DG CHEST 2V
2 series · 2 of 2 positions shown · non-contrast
Comparison: None.

CLINICAL DATA: Chest pain for 1 hour

EXAM:
CHEST - 2 VIEW

[chest pa]
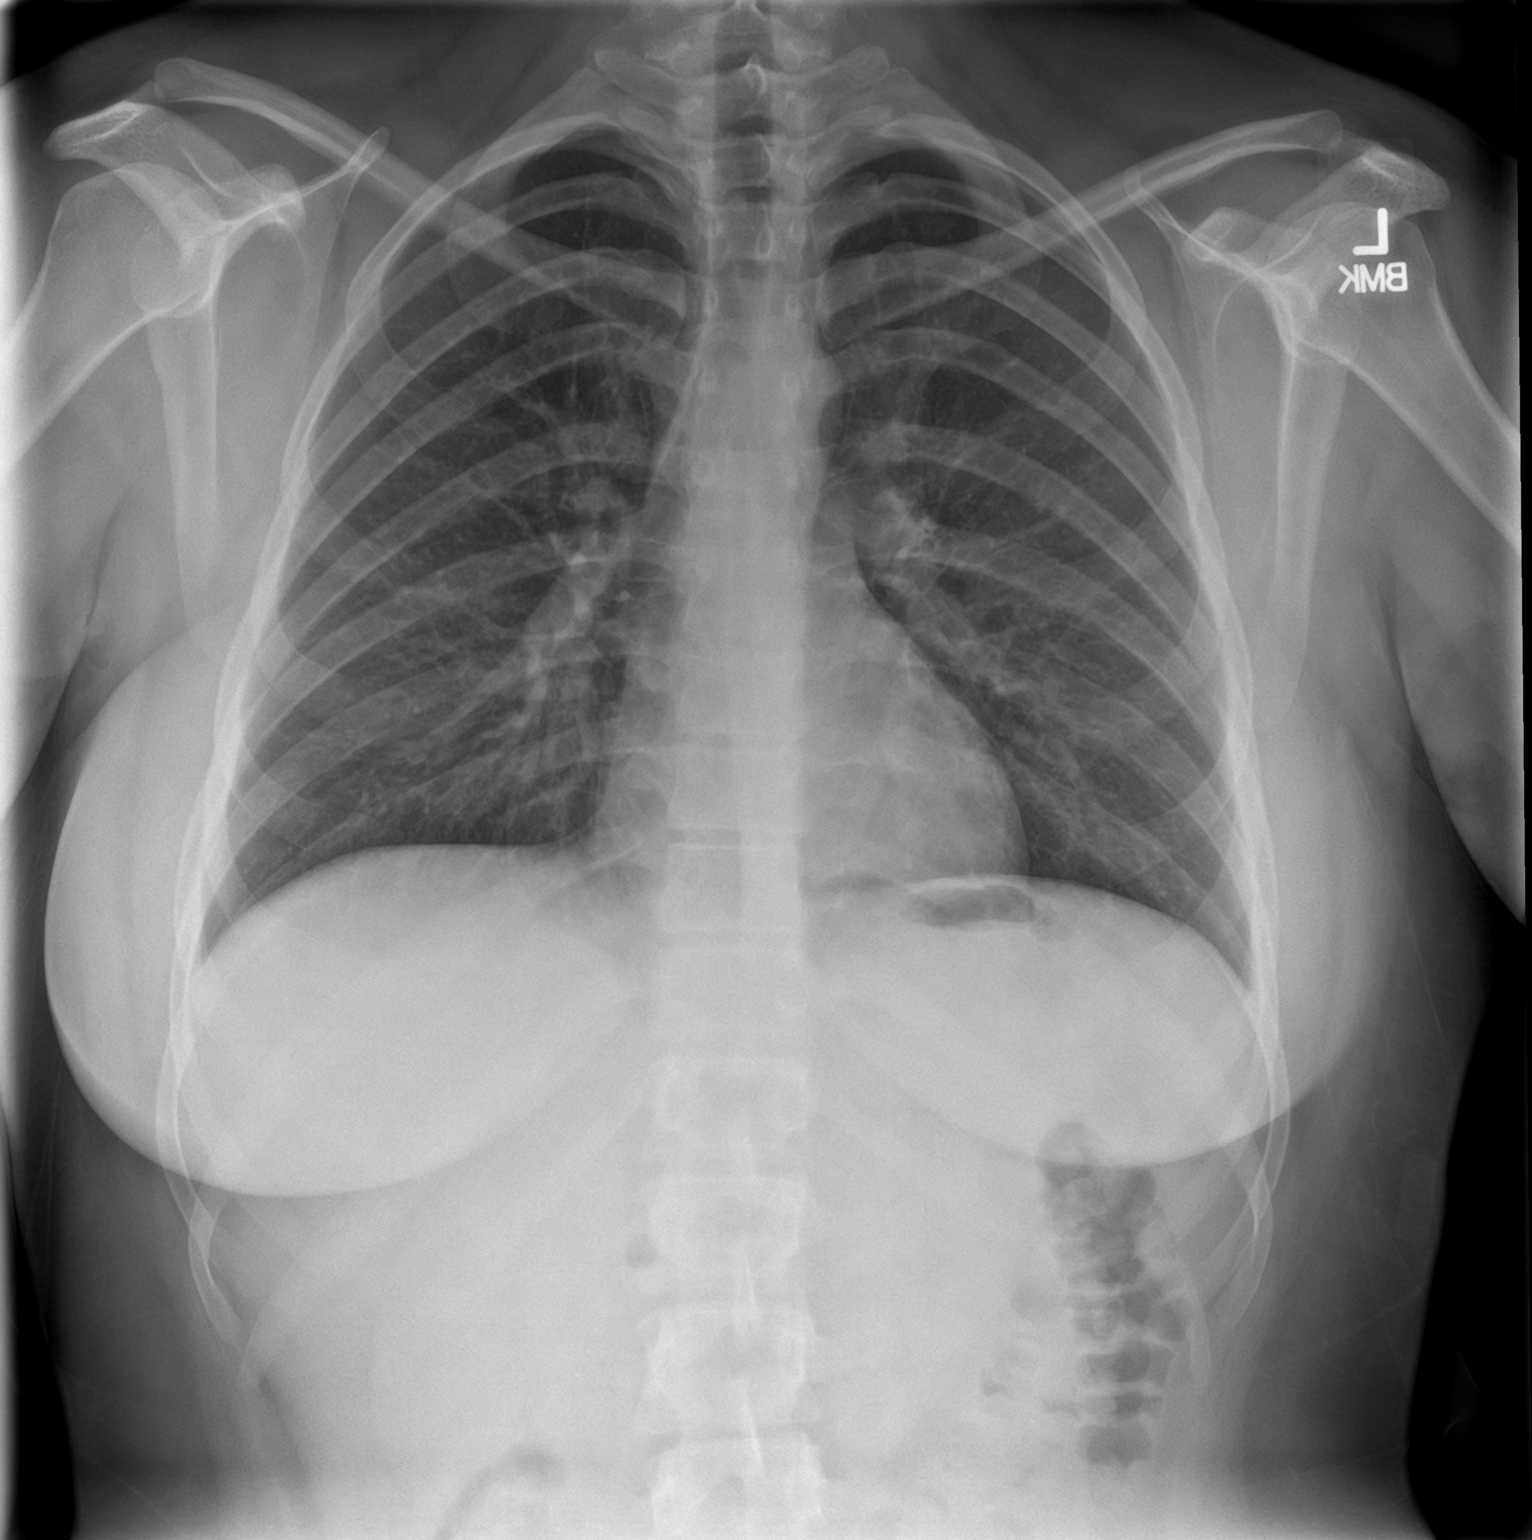

[chest lat]
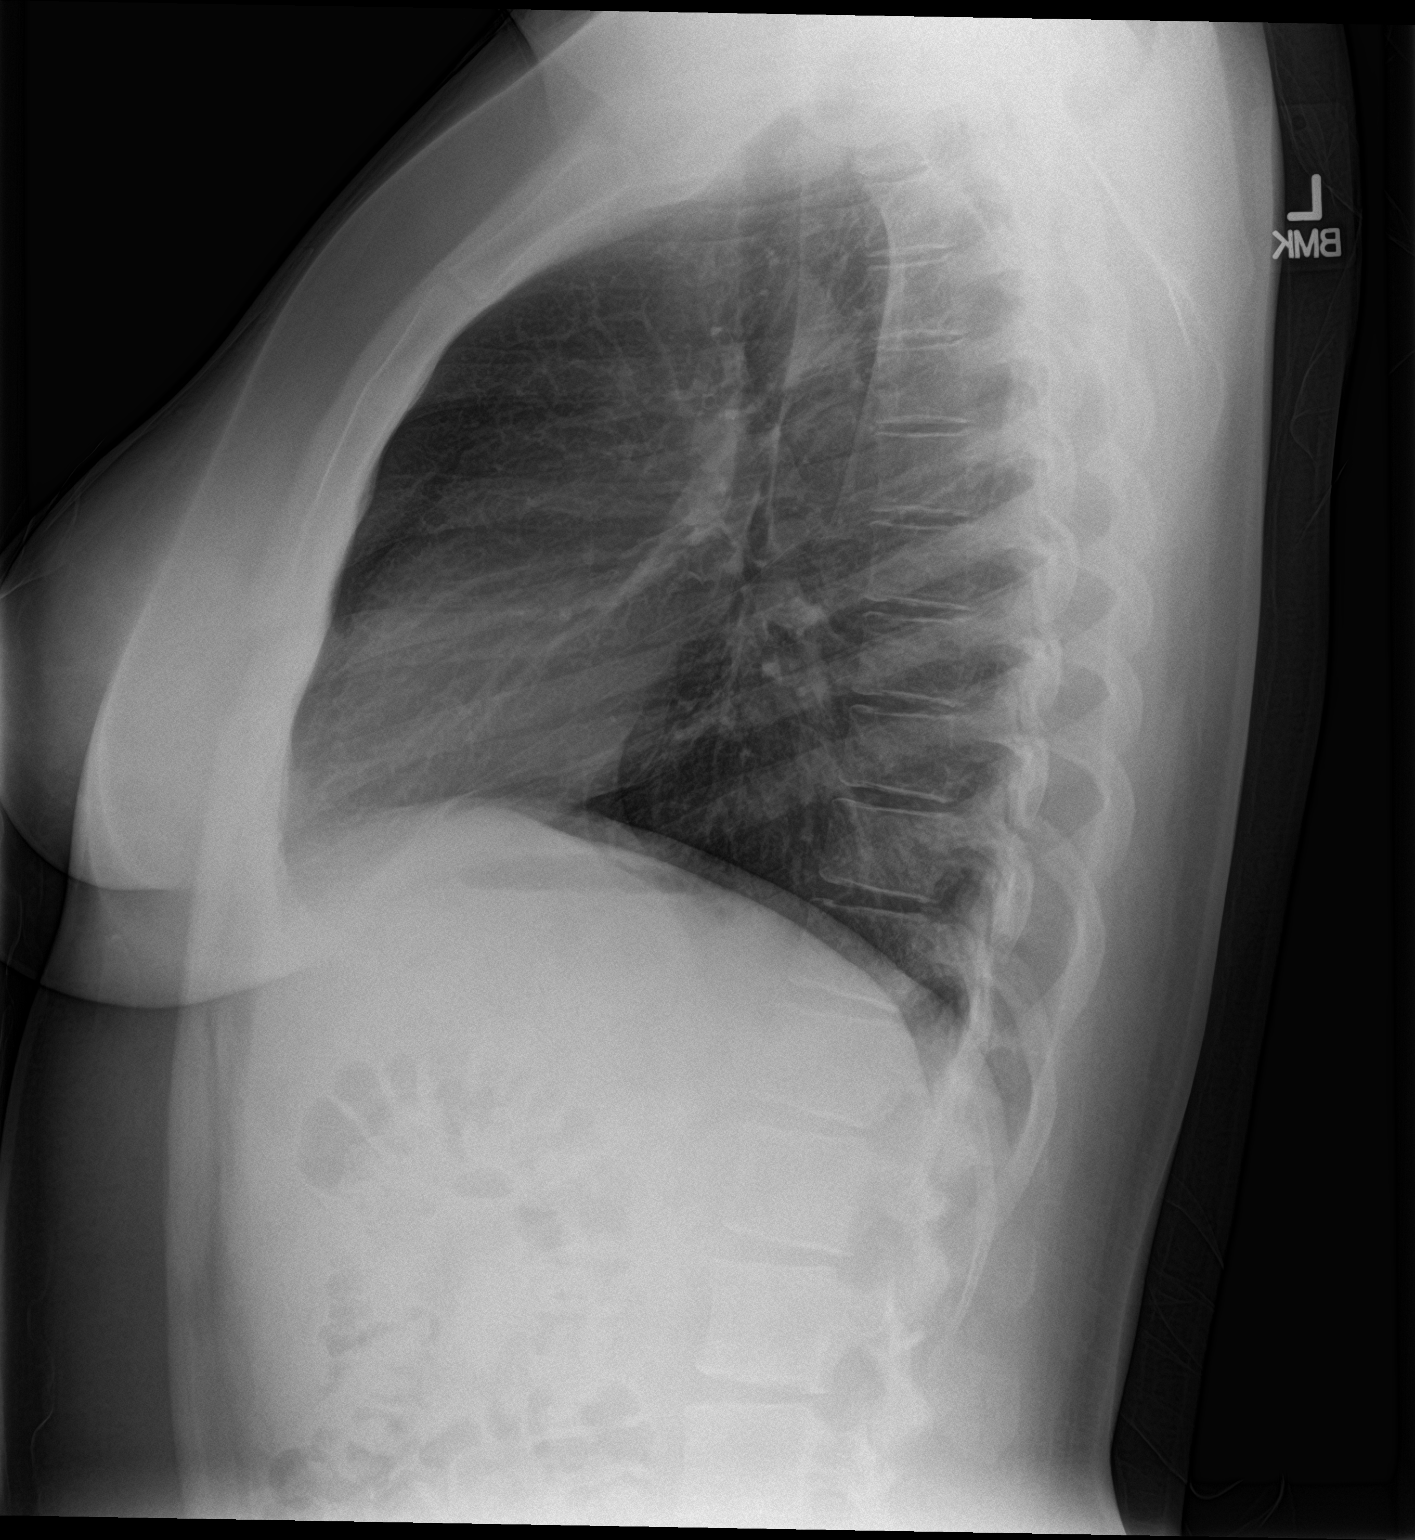

[2 of 2 positions shown; findings below may reference images not displayed]

FINDINGS: No consolidation, features of edema, pneumothorax, or effusion.
Pulmonary vascularity is normally distributed. The cardiomediastinal
contours are unremarkable. No acute osseous or soft tissue
abnormality.
IMPRESSION: No acute cardiopulmonary abnormality.

## 2021-05-17 ENCOUNTER — Ambulatory Visit: Payer: Self-pay | Admitting: Nurse Practitioner

## 2021-05-22 ENCOUNTER — Telehealth: Payer: 59 | Admitting: Family

## 2021-05-22 DIAGNOSIS — J029 Acute pharyngitis, unspecified: Secondary | ICD-10-CM | POA: Diagnosis not present

## 2021-05-22 MED ORDER — AMOXICILLIN 500 MG PO CAPS: 500.0000 mg | ORAL_CAPSULE | Freq: Two times a day (BID) | ORAL | 0 refills | Status: AC

## 2021-05-22 NOTE — Progress Notes (Signed)

## 2021-06-03 ENCOUNTER — Telehealth: Payer: 59 | Admitting: Physician Assistant

## 2021-06-03 DIAGNOSIS — N3090 Cystitis, unspecified without hematuria: Secondary | ICD-10-CM | POA: Diagnosis not present

## 2021-06-03 MED ORDER — CEPHALEXIN 500 MG PO CAPS: 500.0000 mg | ORAL_CAPSULE | Freq: Two times a day (BID) | ORAL | 0 refills | Status: AC

## 2021-06-03 NOTE — Progress Notes (Signed)

## 2021-06-24 ENCOUNTER — Telehealth: Payer: 59 | Admitting: Emergency Medicine

## 2021-06-24 DIAGNOSIS — R300 Dysuria: Secondary | ICD-10-CM

## 2021-06-24 MED ORDER — CEPHALEXIN 500 MG PO CAPS: 500.0000 mg | ORAL_CAPSULE | Freq: Two times a day (BID) | ORAL | 0 refills | Status: DC

## 2021-06-24 NOTE — Progress Notes (Signed)

## 2022-01-19 ENCOUNTER — Telehealth: Payer: Self-pay | Admitting: Physician Assistant

## 2022-01-19 DIAGNOSIS — J029 Acute pharyngitis, unspecified: Secondary | ICD-10-CM

## 2022-01-19 NOTE — Progress Notes (Signed)
E-Visit for Sore Throat   We are sorry that you are not feeling well.  Here is how we plan to help!   Your symptoms indicate a likely viral infection (Pharyngitis).   Pharyngitis is inflammation in the back of the throat which can cause a sore throat, scratchiness and sometimes difficulty swallowing.   Pharyngitis is typically caused by a respiratory virus and will just run its course.  Please keep in mind that your symptoms could last up to 10 days.  For throat pain, we recommend over the counter oral pain relief medications such as acetaminophen or aspirin, or anti-inflammatory medications such as ibuprofen or naproxen sodium.  Topical treatments such as oral throat lozenges or sprays may be used as needed.  Avoid close contact with loved ones, especially the very young and elderly.  Remember to wash your hands thoroughly throughout the day as this is the number one way to prevent the spread of infection and wipe down door knobs and counters with disinfectant.   After careful review of your answers, I would not recommend an antibiotic for your condition.  Antibiotics should not be used to treat conditions that we suspect are caused by viruses like the virus that causes the common cold or flu. However, some people can have Strep with atypical symptoms. You may need formal testing in clinic or office to confirm if your symptoms continue or worsen.   Providers prescribe antibiotics to treat infections caused by bacteria. Antibiotics are very powerful in treating bacterial infections when they are used properly.  To maintain their effectiveness, they should be used only when necessary.  Overuse of antibiotics has resulted in the development of super bugs that are resistant to treatment!     Home Care: Only take medications as instructed by your medical team. Do not drink alcohol while taking these medications. A steam or ultrasonic humidifier can help congestion.  You can place a towel over your head and  breathe in the steam from hot water coming from a faucet. Avoid close contacts especially the very young and the elderly. Cover your mouth when you cough or sneeze. Always remember to wash your hands.   Get Help Right Away If: You develop worsening fever or throat pain. You develop a severe head ache or visual changes. Your symptoms persist after you have completed your treatment plan.   Make sure you Understand these instructions. Will watch your condition. Will get help right away if you are not doing well or get worse.     Thank you for choosing an e-visit.   Your e-visit answers were reviewed by a board certified advanced clinical practitioner to complete your personal care plan. Depending upon the condition, your plan could have included both over the counter or prescription medications.   Please review your pharmacy choice. Make sure the pharmacy is open so you can pick up prescription now. If there is a problem, you may contact your provider through MyChart messaging and have the prescription routed to another pharmacy.  Your safety is important to us. If you have drug allergies check your prescription carefully.    For the next 24 hours you can use MyChart to ask questions about today's visit, request a non-urgent call back, or ask for a work or school excuse. You will get an email in the next two days asking about your experience. I hope that your e-visit has been valuable and will speed your recovery.  

## 2022-01-19 NOTE — Progress Notes (Signed)
I have spent 5 minutes in review of e-visit questionnaire, review and updating patient chart, medical decision making and response to patient.   Deneshia Zucker Cody Hatsuko Bizzarro, PA-C    

## 2022-04-15 ENCOUNTER — Telehealth: Payer: Self-pay | Admitting: Nurse Practitioner

## 2022-04-15 DIAGNOSIS — R6889 Other general symptoms and signs: Secondary | ICD-10-CM

## 2022-04-15 MED ORDER — OSELTAMIVIR PHOSPHATE 75 MG PO CAPS: 75.0000 mg | ORAL_CAPSULE | Freq: Two times a day (BID) | ORAL | 0 refills | Status: AC

## 2022-04-15 NOTE — Progress Notes (Signed)
I have spent 5 minutes in review of e-visit questionnaire, review and updating patient chart, medical decision making and response to patient.  ° °Annet Manukyan W Erminia Mcnew, NP ° °  °

## 2022-04-15 NOTE — Progress Notes (Signed)

## 2022-04-25 ENCOUNTER — Telehealth: Payer: Self-pay | Admitting: Emergency Medicine

## 2022-04-25 DIAGNOSIS — N3000 Acute cystitis without hematuria: Secondary | ICD-10-CM

## 2022-04-25 MED ORDER — CEPHALEXIN 500 MG PO CAPS: 500.0000 mg | ORAL_CAPSULE | Freq: Two times a day (BID) | ORAL | 0 refills | Status: DC

## 2022-04-25 NOTE — Progress Notes (Signed)
E-Visit for Urinary Problems ? ?We are sorry that you are not feeling well.  Here is how we plan to help! ? ?Based on what you shared with me it looks like you most likely have a simple urinary tract infection. ? ?A UTI (Urinary Tract Infection) is a bacterial infection of the bladder. ? ?Most cases of urinary tract infections are simple to treat but a key part of your care is to encourage you to drink plenty of fluids and watch your symptoms carefully. ? ?I have prescribed Keflex 500 mg twice a day for 7 days.  Your symptoms should gradually improve. Call us if the burning in your urine worsens, you develop worsening fever, back pain or pelvic pain or if your symptoms do not resolve after completing the antibiotic. ? ?Urinary tract infections can be prevented by drinking plenty of water to keep your body hydrated.  Also be sure when you wipe, wipe from front to back and don't hold it in!  If possible, empty your bladder every 4 hours. ? ?HOME CARE ?Drink plenty of fluids ?Compete the full course of the antibiotics even if the symptoms resolve ?Remember, when you need to go?go. Holding in your urine can increase the likelihood of getting a UTI! ?GET HELP RIGHT AWAY IF: ?You cannot urinate ?You get a high fever ?Worsening back pain occurs ?You see blood in your urine ?You feel sick to your stomach or throw up ?You feel like you are going to pass out ? ?MAKE SURE YOU  ?Understand these instructions. ?Will watch your condition. ?Will get help right away if you are not doing well or get worse. ? ? ?Thank you for choosing an e-visit. ? ?Your e-visit answers were reviewed by a board certified advanced clinical practitioner to complete your personal care plan. Depending upon the condition, your plan could have included both over the counter or prescription medications. ? ?Please review your pharmacy choice. Make sure the pharmacy is open so you can pick up prescription now. If there is a problem, you may contact your  provider through MyChart messaging and have the prescription routed to another pharmacy.  Your safety is important to us. If you have drug allergies check your prescription carefully.  ? ?For the next 24 hours you can use MyChart to ask questions about today's visit, request a non-urgent call back, or ask for a work or school excuse. ?You will get an email in the next two days asking about your experience. I hope that your e-visit has been valuable and will speed your recovery. ? ?I have spent 5 minutes in review of e-visit questionnaire, review and updating patient chart, medical decision making and response to patient.  ? ?Jordana Dugue, PhD, FNP-BC ?  ?

## 2022-06-06 ENCOUNTER — Telehealth: Payer: Self-pay | Admitting: Nurse Practitioner

## 2022-06-06 DIAGNOSIS — N3000 Acute cystitis without hematuria: Secondary | ICD-10-CM

## 2022-06-06 NOTE — Progress Notes (Signed)
Ulice Dash,  After reviewing your chart you have been treated for your last several UTIs over E-visit. We recommend that you have urine testing at this time. This allows [place] to see what antibiotics work well for you, and assure we are treating you correctly.   It looks like your most recent urine culture was in 2020 with some noted resistance.  I would recommend starting with your primary care office, if they cannot get you in you may go to a local Urgent Care  Thank you ! Apolonio Schneiders    I feel your condition warrants further evaluation and I recommend that you be seen for a face to face visit.  Please contact your primary care physician practice to be seen. Many offices offer virtual options to be seen via video if you are not comfortable going in person to a medical facility at this time.  NOTE: You will NOT be charged for this eVisit.  If you do not have a PCP,  offers a free physician referral service available at [phone]. Our trained staff has the experience, knowledge and resources to put you in touch with a physician who is right for you.    If you are having a true medical emergency please call 911.   Your e-visit answers were reviewed by a board certified advanced clinical practitioner to complete your personal care plan.  Thank you for using e-Visits.

## 2022-07-25 ENCOUNTER — Telehealth: Payer: Self-pay | Admitting: Nurse Practitioner

## 2022-07-25 DIAGNOSIS — J029 Acute pharyngitis, unspecified: Secondary | ICD-10-CM

## 2022-07-25 NOTE — Progress Notes (Signed)
  E-Visit for Sore Throat  We are sorry that you are not feeling well.  Here is how we plan to help!  Your symptoms indicate a likely viral infection (Pharyngitis).   Pharyngitis is inflammation in the back of the throat which can cause a sore throat, scratchiness and sometimes difficulty swallowing.   Pharyngitis is typically caused by a respiratory virus and will just run its course.  Please keep in mind that your symptoms could last up to 10 days.  For throat pain, we recommend over the counter oral pain relief medications such as acetaminophen or aspirin, or anti-inflammatory medications such as ibuprofen or naproxen sodium.  Topical treatments such as oral throat lozenges or sprays may be used as needed.  Avoid close contact with loved ones, especially the very young and elderly.  Remember to wash your hands thoroughly throughout the day as this is the number one way to prevent the spread of infection and wipe down door knobs and counters with disinfectant.  After careful review of your answers, I would not recommend an antibiotic for your condition.  Antibiotics should not be used to treat conditions that we suspect are caused by viruses like the virus that causes the common cold or flu. However, some people can have Strep with atypical symptoms. You may need formal testing in clinic or office to confirm if your symptoms continue or worsen.  Providers prescribe antibiotics to treat infections caused by bacteria. Antibiotics are very powerful in treating bacterial infections when they are used properly.  To maintain their effectiveness, they should be used only when necessary.  Overuse of antibiotics has resulted in the development of super bugs that are resistant to treatment!    Home Care: Only take medications as instructed by your medical team. Do not drink alcohol while taking these medications. A steam or ultrasonic humidifier can help congestion.  You can place a towel over your head and  breathe in the steam from hot water coming from a faucet. Avoid close contacts especially the very young and the elderly. Cover your mouth when you cough or sneeze. Always remember to wash your hands.  Get Help Right Away If: You develop worsening fever or throat pain. You develop a severe head ache or visual changes. Your symptoms persist after you have completed your treatment plan.  Make sure you Understand these instructions. Will watch your condition. Will get help right away if you are not doing well or get worse.   Thank you for choosing an e-visit.  Your e-visit answers were reviewed by a board certified advanced clinical practitioner to complete your personal care plan. Depending upon the condition, your plan could have included both over the counter or prescription medications.  Please review your pharmacy choice. Make sure the pharmacy is open so you can pick up prescription now. If there is a problem, you may contact your provider through MyChart messaging and have the prescription routed to another pharmacy.  Your safety is important to us. If you have drug allergies check your prescription carefully.   For the next 24 hours you can use MyChart to ask questions about today's visit, request a non-urgent call back, or ask for a work or school excuse. You will get an email in the next two days asking about your experience. I hope that your e-visit has been valuable and will speed your recovery.   I spent approximately 5 minutes reviewing the patient's history, current symptoms and coordinating their care today.   

## 2022-09-25 ENCOUNTER — Telehealth: Payer: Self-pay | Admitting: Nurse Practitioner

## 2022-09-25 DIAGNOSIS — J029 Acute pharyngitis, unspecified: Secondary | ICD-10-CM

## 2022-09-25 NOTE — Progress Notes (Signed)
E-Visit for Sore Throat  We are sorry that you are not feeling well.  Here is how we plan to help!  Your symptoms may be from COVID as you have recently been exposed, if possible you may want to consider at home testing.   Your symptoms indicate a likely viral infection (Pharyngitis).   Pharyngitis is inflammation in the back of the throat which can cause a sore throat, scratchiness and sometimes difficulty swallowing.   Pharyngitis is typically caused by a respiratory virus and will just run its course.  Please keep in mind that your symptoms could last up to 10 days.  For throat pain, we recommend over the counter oral pain relief medications such as acetaminophen or aspirin, or anti-inflammatory medications such as ibuprofen or naproxen sodium.  Topical treatments such as oral throat lozenges or sprays may be used as needed.  Avoid close contact with loved ones, especially the very young and elderly.  Remember to wash your hands thoroughly throughout the day as this is the number one way to prevent the spread of infection and wipe down door knobs and counters with disinfectant.  After careful review of your answers, I would not recommend an antibiotic for your condition.  Antibiotics should not be used to treat conditions that we suspect are caused by viruses like the virus that causes the common cold or flu. However, some people can have Strep with atypical symptoms. You may need formal testing in clinic or office to confirm if your symptoms continue or worsen.  Providers prescribe antibiotics to treat infections caused by bacteria. Antibiotics are very powerful in treating bacterial infections when they are used properly.  To maintain their effectiveness, they should be used only when necessary.  Overuse of antibiotics has resulted in the development of super bugs that are resistant to treatment!    Home Care: Only take medications as instructed by your medical team. Do not drink alcohol while  taking these medications. A steam or ultrasonic humidifier can help congestion.  You can place a towel over your head and breathe in the steam from hot water coming from a faucet. Avoid close contacts especially the very young and the elderly. Cover your mouth when you cough or sneeze. Always remember to wash your hands.  Get Help Right Away If: You develop worsening fever or throat pain. You develop a severe head ache or visual changes. Your symptoms persist after you have completed your treatment plan.  Make sure you Understand these instructions. Will watch your condition. Will get help right away if you are not doing well or get worse.   Thank you for choosing an e-visit.  Your e-visit answers were reviewed by a board certified advanced clinical practitioner to complete your personal care plan. Depending upon the condition, your plan could have included both over the counter or prescription medications.  Please review your pharmacy choice. Make sure the pharmacy is open so you can pick up prescription now. If there is a problem, you may contact your provider through [employer] and have the prescription routed to another pharmacy.  Your safety is important to [place]. If you have drug allergies check your prescription carefully.   For the next 24 hours you can use MyChart to ask questions about today's visit, request a non-urgent call back, or ask for a work or school excuse. You will get an email in the next two days asking about your experience. I hope that your e-visit has been valuable and will  speed your recovery.   I spent approximately 5 minutes reviewing the patient's history, current symptoms and coordinating their care today.

## 2023-07-11 ENCOUNTER — Telehealth: Payer: Self-pay | Admitting: Physician Assistant

## 2023-07-11 DIAGNOSIS — R3989 Other symptoms and signs involving the genitourinary system: Secondary | ICD-10-CM

## 2023-07-11 MED ORDER — CEPHALEXIN 500 MG PO CAPS: 500.0000 mg | ORAL_CAPSULE | Freq: Two times a day (BID) | ORAL | 0 refills | Status: AC

## 2023-07-11 NOTE — Progress Notes (Signed)

## 2023-07-11 NOTE — Progress Notes (Signed)
 I have spent 5 minutes in review of e-visit questionnaire, review and updating patient chart, medical decision making and response to patient.   Piedad Climes, PA-C

## 2023-08-14 ENCOUNTER — Telehealth: Payer: Self-pay | Admitting: Physician Assistant

## 2023-08-14 DIAGNOSIS — J039 Acute tonsillitis, unspecified: Secondary | ICD-10-CM

## 2023-08-14 MED ORDER — AMOXICILLIN 500 MG PO CAPS: 500.0000 mg | ORAL_CAPSULE | Freq: Two times a day (BID) | ORAL | 0 refills | Status: AC

## 2023-08-14 NOTE — Progress Notes (Signed)

## 2024-01-03 ENCOUNTER — Telehealth: Payer: Self-pay | Admitting: Physician Assistant

## 2024-01-03 DIAGNOSIS — J069 Acute upper respiratory infection, unspecified: Secondary | ICD-10-CM

## 2024-01-03 MED ORDER — PSEUDOEPH-BROMPHEN-DM 30-2-10 MG/5ML PO SYRP
5.0000 mL | ORAL_SOLUTION | Freq: Four times a day (QID) | ORAL | 0 refills | Status: AC | PRN
Start: 2024-01-03 — End: ?

## 2024-01-03 MED ORDER — FLUTICASONE PROPIONATE 50 MCG/ACT NA SUSP: 2.0000 | Freq: Every day | NASAL | 0 refills | Status: AC

## 2024-01-03 MED ORDER — LIDOCAINE VISCOUS HCL 2 % MT SOLN: 5.0000 mL | Freq: Four times a day (QID) | OROMUCOSAL | 0 refills | Status: AC | PRN

## 2024-01-03 NOTE — Progress Notes (Signed)

## 2024-03-20 ENCOUNTER — Telehealth: Payer: Self-pay | Admitting: Family Medicine

## 2024-03-20 DIAGNOSIS — J029 Acute pharyngitis, unspecified: Secondary | ICD-10-CM

## 2024-03-20 NOTE — Progress Notes (Signed)
 We are sorry that you are not feeling well.  Here is how we plan to help!  Your symptoms indicate a likely viral infection (Pharyngitis).   Pharyngitis is inflammation in the back of the throat which can cause a sore throat, scratchiness and sometimes difficulty swallowing.   Pharyngitis is typically caused by a respiratory virus and will just run its course.  Please keep in mind that your symptoms could last up to 10 days.    For throat pain, we recommend over the counter oral pain relief medications such as acetaminophen  or aspirin, or anti-inflammatory medications such as ibuprofen  or naproxen  sodium.  Topical treatments such as oral throat lozenges or sprays may be used as needed. For throat pain, I have prescribed a Viscous Lidocaine 2% solution. Swallow 5-10 mL every 4-6 hours as needed for sore throat. DO NOT eat or drink anything for 15-20 minutes after swallowing to allow the medication to coat the throat.SABRA  Avoid close contact with loved ones, especially the very young and elderly.  Remember to wash your hands thoroughly throughout the day as this is the number one way to prevent the spread of infection! We also recommend that you periodically wipe down door knobs and counters with disinfectant.  After careful review of your answers, I would not recommend and antibiotic for your condition.  Antibiotics should not be used to treat conditions that we suspect are caused by viruses like the virus that causes the common cold or flu.  Providers prescribe antibiotics to treat infections caused by bacteria. Antibiotics are very powerful in treating bacterial infections when they are used properly.  To maintain their effectiveness, they should be used only when necessary.  Overuse of antibiotics has resulted in the development of super bugs that are resistant to treatment!    Some people with strep throat, however, can have atypical symptoms. As such, if anything continued to progress despite treatment  recommendations, you may need formal testing in clinic or office.  Home Care: Only take medications as instructed by your medical team. Do not drink alcohol while taking these medications. A steam or ultrasonic humidifier can help congestion.  You can place a towel over your head and breathe in the steam from hot water coming from a faucet. Avoid close contacts especially the very young and the elderly. Cover your mouth when you cough or sneeze. Always remember to wash your hands.  Get Help Right Away If: You develop worsening fever or throat pain. You develop a severe head ache or visual changes. Your symptoms persist after you have completed your treatment plan.  Make sure you Understand these instructions. Will watch your condition. Will get help right away if you are not doing well or get worse.  Your e-visit answers were reviewed by a board certified advanced clinical practitioner to complete your personal care plan.  Depending on the condition, your plan could have included both over the counter or prescription medications.  If there is a problem please reply once you have received a response from your provider.  Your safety is important to us .  If you have drug allergies check your prescription carefully.    You can use MyChart to ask questions about todays visit, request a non-urgent call back, or ask for a work or school excuse for 24 hours related to this e-Visit. If it has been greater than 24 hours you will need to follow up with your provider, or enter a new e-Visit to address those concerns.  You will get an e-mail in the next two days asking about your experience.  I hope that your e-visit has been valuable and will speed your recovery. Thank you for using e-visits.   I have spent 5 minutes in review of e-visit questionnaire, review and updating patient chart, medical decision making and response to patient.   Journiee Feldkamp, FNP
